# Patient Record
Sex: Male | Born: 1990 | Race: Black or African American | Hispanic: No | Marital: Single | Smoking: Current some day smoker
Health system: Southern US, Community
[De-identification: ages and names within clinical notes are randomized; demographics above are authoritative.]

## PROBLEM LIST (undated history)

## (undated) DIAGNOSIS — G43909 Migraine, unspecified, not intractable, without status migrainosus: Secondary | ICD-10-CM

## (undated) DIAGNOSIS — S62309A Unspecified fracture of unspecified metacarpal bone, initial encounter for closed fracture: Secondary | ICD-10-CM

## (undated) DIAGNOSIS — Z8709 Personal history of other diseases of the respiratory system: Secondary | ICD-10-CM

## (undated) DIAGNOSIS — S52501A Unspecified fracture of the lower end of right radius, initial encounter for closed fracture: Secondary | ICD-10-CM

## (undated) HISTORY — PX: EYE MUSCLE SURGERY: SHX370

---
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
## 2012-01-31 ENCOUNTER — Encounter (HOSPITAL_BASED_OUTPATIENT_CLINIC_OR_DEPARTMENT_OTHER): Payer: Self-pay | Admitting: Emergency Medicine

## 2012-01-31 DIAGNOSIS — M25519 Pain in unspecified shoulder: Secondary | ICD-10-CM | POA: Insufficient documentation

## 2012-01-31 DIAGNOSIS — Z043 Encounter for examination and observation following other accident: Secondary | ICD-10-CM | POA: Insufficient documentation

## 2012-01-31 DIAGNOSIS — M25559 Pain in unspecified hip: Secondary | ICD-10-CM | POA: Insufficient documentation

## 2012-01-31 DIAGNOSIS — M79609 Pain in unspecified limb: Secondary | ICD-10-CM | POA: Insufficient documentation

## 2012-01-31 NOTE — ED Notes (Signed)
Pt ambulatory without difficulty. Pt picking up 21 y/o child with injured arm without difficulty.
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
## 2012-02-01 ENCOUNTER — Emergency Department (HOSPITAL_BASED_OUTPATIENT_CLINIC_OR_DEPARTMENT_OTHER)
Admission: EM | Admit: 2012-02-01 | Discharge: 2012-02-01 | Discharge status: Against Medical Advice | Payer: No Typology Code available for payment source | Attending: Emergency Medicine | Admitting: Emergency Medicine

## 2012-02-01 NOTE — ED Notes (Signed)
Pt LWBS due to wait. Pt a7O x 4. Pt ambulatory without difficulty

## 2014-02-24 DIAGNOSIS — S52501A Unspecified fracture of the lower end of right radius, initial encounter for closed fracture: Secondary | ICD-10-CM

## 2014-02-24 DIAGNOSIS — S62309A Unspecified fracture of unspecified metacarpal bone, initial encounter for closed fracture: Secondary | ICD-10-CM

## 2014-02-24 HISTORY — DX: Unspecified fracture of unspecified metacarpal bone, initial encounter for closed fracture: S62.309A

## 2014-02-24 HISTORY — DX: Unspecified fracture of the lower end of right radius, initial encounter for closed fracture: S52.501A

## 2014-02-25 ENCOUNTER — Emergency Department (HOSPITAL_COMMUNITY)
Admission: EM | Admit: 2014-02-25 | Discharge: 2014-02-25 | Disposition: A | Discharge status: Home / Self Care | Payer: Self-pay | Attending: Emergency Medicine | Admitting: Emergency Medicine

## 2014-02-25 ENCOUNTER — Emergency Department (HOSPITAL_COMMUNITY): Payer: Self-pay

## 2014-02-25 ENCOUNTER — Encounter (HOSPITAL_COMMUNITY): Payer: Self-pay | Admitting: Emergency Medicine

## 2014-02-25 DIAGNOSIS — S52599A Other fractures of lower end of unspecified radius, initial encounter for closed fracture: Secondary | ICD-10-CM | POA: Insufficient documentation

## 2014-02-25 DIAGNOSIS — S62339A Displaced fracture of neck of unspecified metacarpal bone, initial encounter for closed fracture: Secondary | ICD-10-CM | POA: Insufficient documentation

## 2014-02-25 DIAGNOSIS — F17200 Nicotine dependence, unspecified, uncomplicated: Secondary | ICD-10-CM | POA: Insufficient documentation

## 2014-02-25 DIAGNOSIS — S5290XA Unspecified fracture of unspecified forearm, initial encounter for closed fracture: Secondary | ICD-10-CM

## 2014-02-25 MED ORDER — OXYCODONE-ACETAMINOPHEN 5-325 MG PO TABS: 2.0000 | ORAL_TABLET | Freq: Four times a day (QID) | ORAL | Status: DC | PRN

## 2014-02-25 MED ORDER — OXYCODONE-ACETAMINOPHEN 5-325 MG PO TABS
2.0000 | ORAL_TABLET | Freq: Once | ORAL | Status: AC
  Administered 2014-02-25: 2 via ORAL
  Filled 2014-02-25: qty 2

## 2014-02-25 NOTE — Progress Notes (Signed)
Orthopedic Tech Progress Note Patient Details:  Marvin ProvidenceCharles R Phillips Jan 16, 1991 [id number]  Ortho Devices Type of Ortho Device: Ace wrap;Arm sling;Sugartong splint;Ulna gutter splint Ortho Device/Splint Location: RUE Ortho Device/Splint Interventions: Ordered   Marvin Moccasinnthony Craig Jawaun Phillips 02/25/2014, 4:47 PM

## 2014-02-25 NOTE — ED Notes (Signed)
Ortho paged. 
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
## 2014-02-25 NOTE — ED Provider Notes (Signed)
Patient signed out to me by [facility] For Behavioral Healthzekalski, PA-C.  Plan:    Follow-up with Dr. Merlyn LotKuzma.  SPLINT APPLICATION Date/Time: 4:20 PM Authorized by: Roxy Horsemanobert Sham Alviar Consent: Verbal consent obtained. Risks and benefits: risks, benefits and alternatives were discussed Consent given by: patient Splint applied by: orthopedic technician Location details: right wrist Splint type: Sugar tong/ulnar gutter  Supplies used: fiberglass  Post-procedure: The splinted body part was neurovascularly unchanged following the procedure. Patient tolerance: Patient tolerated the procedure well with no immediate complications.  Will discharge the patient to home and have him follow-up with Dr. Merlyn LotKuzma.  Percocet for pain.     Roxy Horsemanobert Minnah Llamas, PA-C 02/25/14 [phone]
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
## 2014-02-26 NOTE — ED Provider Notes (Signed)
Medical screening examination/treatment/procedure(s) were performed by non-physician practitioner and as supervising physician I was immediately available for consultation/collaboration.   EKG Interpretation None       Lakevia Perris R. Valera Vallas, MD 02/26/14 0054 

## 2014-02-27 ENCOUNTER — Encounter (HOSPITAL_BASED_OUTPATIENT_CLINIC_OR_DEPARTMENT_OTHER): Payer: Self-pay | Admitting: *Deleted

## 2014-02-28 ENCOUNTER — Other Ambulatory Visit: Payer: Self-pay | Admitting: Orthopedic Surgery

## 2014-02-28 NOTE — ED Provider Notes (Signed)
Medical screening examination/treatment/procedure(s) were performed by non-physician practitioner and as supervising physician I was immediately available for consultation/collaboration.   EKG Interpretation None       Shah Insley R. Kendre Sires, MD 02/28/14 1102 

## 2014-03-04 ENCOUNTER — Encounter (HOSPITAL_BASED_OUTPATIENT_CLINIC_OR_DEPARTMENT_OTHER): Payer: Self-pay | Admitting: Certified Registered"

## 2014-03-04 ENCOUNTER — Ambulatory Visit (HOSPITAL_BASED_OUTPATIENT_CLINIC_OR_DEPARTMENT_OTHER): Payer: Self-pay | Admitting: Certified Registered"

## 2014-03-04 ENCOUNTER — Encounter (HOSPITAL_BASED_OUTPATIENT_CLINIC_OR_DEPARTMENT_OTHER)
Admission: RE | Disposition: A | Discharge status: Home / Self Care | Payer: Self-pay | Source: Ambulatory Visit | Attending: Orthopedic Surgery

## 2014-03-04 ENCOUNTER — Ambulatory Visit (HOSPITAL_BASED_OUTPATIENT_CLINIC_OR_DEPARTMENT_OTHER)
Admission: RE | Admit: 2014-03-04 | Discharge: 2014-03-04 | Disposition: A | Discharge status: Home / Self Care | Payer: Self-pay | Source: Ambulatory Visit | Attending: Orthopedic Surgery | Admitting: Orthopedic Surgery

## 2014-03-04 DIAGNOSIS — G43909 Migraine, unspecified, not intractable, without status migrainosus: Secondary | ICD-10-CM | POA: Insufficient documentation

## 2014-03-04 DIAGNOSIS — F17200 Nicotine dependence, unspecified, uncomplicated: Secondary | ICD-10-CM | POA: Insufficient documentation

## 2014-03-04 DIAGNOSIS — Z91013 Allergy to seafood: Secondary | ICD-10-CM | POA: Insufficient documentation

## 2014-03-04 DIAGNOSIS — Y9367 Activity, basketball: Secondary | ICD-10-CM | POA: Insufficient documentation

## 2014-03-04 DIAGNOSIS — S52599A Other fractures of lower end of unspecified radius, initial encounter for closed fracture: Secondary | ICD-10-CM | POA: Insufficient documentation

## 2014-03-04 DIAGNOSIS — W19XXXA Unspecified fall, initial encounter: Secondary | ICD-10-CM | POA: Insufficient documentation

## 2014-03-04 DIAGNOSIS — S63006A Unspecified dislocation of unspecified wrist and hand, initial encounter: Secondary | ICD-10-CM | POA: Insufficient documentation

## 2014-03-04 DIAGNOSIS — S62339A Displaced fracture of neck of unspecified metacarpal bone, initial encounter for closed fracture: Secondary | ICD-10-CM | POA: Insufficient documentation

## 2014-03-04 HISTORY — DX: Unspecified fracture of the lower end of right radius, initial encounter for closed fracture: S52.501A

## 2014-03-04 HISTORY — PX: OPEN REDUCTION INTERNAL FIXATION (ORIF) DISTAL RADIAL FRACTURE: SHX5989

## 2014-03-04 HISTORY — DX: Migraine, unspecified, not intractable, without status migrainosus: G43.909

## 2014-03-04 HISTORY — DX: Personal history of other diseases of the respiratory system: Z87.09

## 2014-03-04 HISTORY — DX: Unspecified fracture of unspecified metacarpal bone, initial encounter for closed fracture: S62.309A

## 2014-03-04 HISTORY — PX: CLOSED REDUCTION METACARPAL WITH PERCUTANEOUS PINNING: SHX5613

## 2014-03-04 LAB — POCT HEMOGLOBIN-HEMACUE: Hemoglobin: 16.1 g/dL (ref 13.0–17.0)

## 2014-03-04 SURGERY — OPEN REDUCTION INTERNAL FIXATION (ORIF) DISTAL RADIUS FRACTURE
Anesthesia: Regional | Site: Wrist | Laterality: Right

## 2014-03-04 MED ORDER — FENTANYL CITRATE 0.05 MG/ML IJ SOLN
50.0000 ug | INTRAMUSCULAR | Status: DC | PRN
  Administered 2014-03-04: 100 ug via INTRAVENOUS

## 2014-03-04 MED ORDER — LACTATED RINGERS IV SOLN
INTRAVENOUS | Status: DC
  Administered 2014-03-04 (×2): via INTRAVENOUS

## 2014-03-04 MED ORDER — MIDAZOLAM HCL 5 MG/5ML IJ SOLN
INTRAMUSCULAR | Status: DC | PRN
  Administered 2014-03-04: 2 mg via INTRAVENOUS

## 2014-03-04 MED ORDER — FENTANYL CITRATE 0.05 MG/ML IJ SOLN
INTRAMUSCULAR | Status: AC
  Filled 2014-03-04: qty 6

## 2014-03-04 MED ORDER — ACETAMINOPHEN 325 MG PO TABS: 325.0000 mg | ORAL_TABLET | ORAL | Status: DC | PRN

## 2014-03-04 MED ORDER — ONDANSETRON HCL 4 MG/2ML IJ SOLN: INTRAMUSCULAR | Status: DC | PRN

## 2014-03-04 MED ORDER — CEFAZOLIN SODIUM-DEXTROSE 2-3 GM-% IV SOLR
INTRAVENOUS | Status: AC
  Filled 2014-03-04: qty 50

## 2014-03-04 MED ORDER — ONDANSETRON HCL 4 MG/2ML IJ SOLN: 4.0000 mg | Freq: Once | INTRAMUSCULAR | Status: DC | PRN

## 2014-03-04 MED ORDER — ONDANSETRON HCL 4 MG/2ML IJ SOLN
INTRAMUSCULAR | Status: DC | PRN
  Administered 2014-03-04: 4 mg via INTRAVENOUS

## 2014-03-04 MED ORDER — LIDOCAINE HCL (CARDIAC) 20 MG/ML IV SOLN
INTRAVENOUS | Status: DC | PRN
  Administered 2014-03-04: 30 mg via INTRAVENOUS

## 2014-03-04 MED ORDER — MIDAZOLAM HCL 2 MG/2ML IJ SOLN
INTRAMUSCULAR | Status: AC
  Filled 2014-03-04: qty 2

## 2014-03-04 MED ORDER — FENTANYL CITRATE 0.05 MG/ML IJ SOLN
INTRAMUSCULAR | Status: AC
  Filled 2014-03-04: qty 2

## 2014-03-04 MED ORDER — PROPOFOL 10 MG/ML IV BOLUS
INTRAVENOUS | Status: DC | PRN
  Administered 2014-03-04: 240 mg via INTRAVENOUS

## 2014-03-04 MED ORDER — FENTANYL CITRATE 0.05 MG/ML IJ SOLN: 25.0000 ug | INTRAMUSCULAR | Status: DC | PRN

## 2014-03-04 MED ORDER — ROPIVACAINE HCL 5 MG/ML IJ SOLN
INTRAMUSCULAR | Status: DC | PRN
  Administered 2014-03-04: 20 mL via PERINEURAL

## 2014-03-04 MED ORDER — BUPIVACAINE HCL (PF) 0.25 % IJ SOLN
INTRAMUSCULAR | Status: AC
  Filled 2014-03-04: qty 30

## 2014-03-04 MED ORDER — KETOROLAC TROMETHAMINE 30 MG/ML IJ SOLN: 15.0000 mg | Freq: Once | INTRAMUSCULAR | Status: DC | PRN

## 2014-03-04 MED ORDER — MIDAZOLAM HCL 2 MG/2ML IJ SOLN
1.0000 mg | INTRAMUSCULAR | Status: DC | PRN
  Administered 2014-03-04: 2 mg via INTRAVENOUS

## 2014-03-04 MED ORDER — ACETAMINOPHEN 160 MG/5ML PO SOLN: 325.0000 mg | ORAL | Status: DC | PRN

## 2014-03-04 MED ORDER — OXYCODONE-ACETAMINOPHEN 5-325 MG PO TABS: ORAL_TABLET | ORAL | Status: DC

## 2014-03-04 MED ORDER — CHLORHEXIDINE GLUCONATE 4 % EX LIQD: 60.0000 mL | Freq: Once | CUTANEOUS | Status: DC

## 2014-03-04 MED ORDER — OXYCODONE HCL 5 MG/5ML PO SOLN: 5.0000 mg | Freq: Once | ORAL | Status: DC | PRN

## 2014-03-04 MED ORDER — FENTANYL CITRATE 0.05 MG/ML IJ SOLN
INTRAMUSCULAR | Status: DC | PRN
  Administered 2014-03-04: 25 ug via INTRAVENOUS

## 2014-03-04 MED ORDER — DEXAMETHASONE SODIUM PHOSPHATE 10 MG/ML IJ SOLN
INTRAMUSCULAR | Status: DC | PRN
  Administered 2014-03-04: 10 mg via INTRAVENOUS

## 2014-03-04 MED ORDER — CEFAZOLIN SODIUM-DEXTROSE 2-3 GM-% IV SOLR
2.0000 g | INTRAVENOUS | Status: AC
  Administered 2014-03-04: 2 g via INTRAVENOUS

## 2014-03-04 MED ORDER — MIDAZOLAM HCL 2 MG/ML PO SYRP: 12.0000 mg | ORAL_SOLUTION | Freq: Once | ORAL | Status: DC | PRN

## 2014-03-04 MED ORDER — OXYCODONE HCL 5 MG PO TABS: 5.0000 mg | ORAL_TABLET | Freq: Once | ORAL | Status: DC | PRN

## 2014-03-04 SURGICAL SUPPLY — 75 items
BANDAGE ELASTIC 3 VELCRO ST LF (GAUZE/BANDAGES/DRESSINGS) ×4 IMPLANT
BIT DRILL 2.0 LNG QUCK RELEASE (BIT) IMPLANT
BIT DRILL 2.8X5 QR DISP (BIT) ×2 IMPLANT
BLADE MINI RND TIP GREEN BEAV (BLADE) IMPLANT
BLADE SURG 15 STRL LF DISP TIS (BLADE) ×4 IMPLANT
BLADE SURG 15 STRL SS (BLADE) ×8
BNDG CMPR 9X4 STRL LF SNTH (GAUZE/BANDAGES/DRESSINGS) ×2
BNDG CMPR MD 5X2 ELC HKLP STRL (GAUZE/BANDAGES/DRESSINGS)
BNDG ELASTIC 2 VLCR STRL LF (GAUZE/BANDAGES/DRESSINGS) IMPLANT
BNDG ESMARK 4X9 LF (GAUZE/BANDAGES/DRESSINGS) ×4 IMPLANT
BNDG GAUZE ELAST 4 BULKY (GAUZE/BANDAGES/DRESSINGS) ×4 IMPLANT
CHLORAPREP W/TINT 26ML (MISCELLANEOUS) ×4 IMPLANT
CORDS BIPOLAR (ELECTRODE) ×4 IMPLANT
COVER MAYO STAND STRL (DRAPES) ×4 IMPLANT
COVER TABLE BACK 60X90 (DRAPES) ×4 IMPLANT
CUFF TOURNIQUET SINGLE 18IN (TOURNIQUET CUFF) ×4 IMPLANT
DRAPE EXTREMITY T 121X128X90 (DRAPE) ×4 IMPLANT
DRAPE OEC MINIVIEW 54X84 (DRAPES) ×4 IMPLANT
DRAPE SURG 17X23 STRL (DRAPES) ×4 IMPLANT
DRILL 2.0 LNG QUICK RELEASE (BIT) ×4
GAUZE XEROFORM 1X8 LF (GAUZE/BANDAGES/DRESSINGS) ×4 IMPLANT
GLOVE BIO SURGEON STRL SZ7.5 (GLOVE) ×6 IMPLANT
GLOVE BIOGEL PI IND STRL 8 (GLOVE) ×2 IMPLANT
GLOVE BIOGEL PI IND STRL 8.5 (GLOVE) IMPLANT
GLOVE BIOGEL PI INDICATOR 8 (GLOVE) ×2
GLOVE BIOGEL PI INDICATOR 8.5 (GLOVE) ×2
GLOVE SURG ORTHO 8.0 STRL STRW (GLOVE) ×2 IMPLANT
GOWN STRL REUS W/ TWL LRG LVL3 (GOWN DISPOSABLE) ×2 IMPLANT
GOWN STRL REUS W/ TWL XL LVL3 (GOWN DISPOSABLE) IMPLANT
GOWN STRL REUS W/TWL LRG LVL3 (GOWN DISPOSABLE)
GOWN STRL REUS W/TWL XL LVL3 (GOWN DISPOSABLE) ×10 IMPLANT
GUIDEWIRE ORTHO 0.054X6 (WIRE) ×6 IMPLANT
K-WIRE .035X4 (WIRE) ×6 IMPLANT
NDL HYPO 25X1 1.5 SAFETY (NEEDLE) IMPLANT
NEEDLE HYPO 22GX1.5 SAFETY (NEEDLE) IMPLANT
NEEDLE HYPO 25X1 1.5 SAFETY (NEEDLE) IMPLANT
NS IRRIG 1000ML POUR BTL (IV SOLUTION) ×4 IMPLANT
PACK BASIN DAY SURGERY FS (CUSTOM PROCEDURE TRAY) ×4 IMPLANT
PAD CAST 3X4 CTTN HI CHSV (CAST SUPPLIES) ×2 IMPLANT
PAD CAST 4YDX4 CTTN HI CHSV (CAST SUPPLIES) IMPLANT
PADDING CAST ABS 4INX4YD NS (CAST SUPPLIES) ×2
PADDING CAST ABS COTTON 4X4 ST (CAST SUPPLIES) ×2 IMPLANT
PADDING CAST COTTON 3X4 STRL (CAST SUPPLIES) ×4
PADDING CAST COTTON 4X4 STRL (CAST SUPPLIES)
PLATE PROXIMAL VDU ACULOC (Plate) ×2 IMPLANT
SCREW CORT FT 18X2.3XLCK HEX (Screw) IMPLANT
SCREW CORT FT 20X2.3XLCK HEX (Screw) IMPLANT
SCREW CORTICAL LOCKING 2.3X18M (Screw) ×4 IMPLANT
SCREW CORTICAL LOCKING 2.3X20M (Screw) ×20 IMPLANT
SCREW FX20X2.3XSMTH LCK NS CRT (Screw) IMPLANT
SCREW HEX 3.5X15 NLCKG STRL (Screw) IMPLANT
SCREW HEX 3.5X15MM (Screw) ×4 IMPLANT
SCREW HEXALOBE NON-LOCK 3.5X14 (Screw) ×2 IMPLANT
SCREW HEXALOBE NON-LOCK 3.5X16 (Screw) ×2 IMPLANT
SLEEVE SCD COMPRESS KNEE MED (MISCELLANEOUS) ×2 IMPLANT
SLING ARM XL FOAM STRAP (SOFTGOODS) ×2 IMPLANT
SPLINT PLASTER CAST XFAST 3X15 (CAST SUPPLIES) IMPLANT
SPLINT PLASTER CAST XFAST 4X15 (CAST SUPPLIES) IMPLANT
SPLINT PLASTER XTRA FAST SET 4 (CAST SUPPLIES)
SPLINT PLASTER XTRA FASTSET 3X (CAST SUPPLIES)
SPONGE GAUZE 4X4 12PLY (GAUZE/BANDAGES/DRESSINGS) ×4 IMPLANT
STOCKINETTE 4X48 STRL (DRAPES) ×4 IMPLANT
SUCTION FRAZIER TIP 10 FR DISP (SUCTIONS) IMPLANT
SUT ETHILON 3 0 PS 1 (SUTURE) IMPLANT
SUT ETHILON 4 0 PS 2 18 (SUTURE) ×4 IMPLANT
SUT MERSILENE 4 0 P 3 (SUTURE) IMPLANT
SUT VIC AB 3-0 PS1 18 (SUTURE)
SUT VIC AB 3-0 PS1 18XBRD (SUTURE) IMPLANT
SUT VICRYL 4-0 PS2 18IN ABS (SUTURE) ×6 IMPLANT
SYR BULB 3OZ (MISCELLANEOUS) ×4 IMPLANT
SYR CONTROL 10ML LL (SYRINGE) IMPLANT
TOWEL OR 17X24 6PK STRL BLUE (TOWEL DISPOSABLE) ×8 IMPLANT
TUBE CONNECTING 20'X1/4 (TUBING)
TUBE CONNECTING 20X1/4 (TUBING) IMPLANT
UNDERPAD 30X30 INCONTINENT (UNDERPADS AND DIAPERS) ×4 IMPLANT

## 2014-03-04 NOTE — Op Note (Signed)
Intra-operative fluoroscopic images in the AP, lateral, and oblique views were taken and evaluated by myself.  Reduction and hardware placement were confirmed.  There was no intraarticular penetration of permanent hardware.  

## 2014-03-04 NOTE — Brief Op Note (Signed)
03/04/2014  3:03 PM  PATIENT:  Marvin Phillips  23 y.o. male  PRE-OPERATIVE DIAGNOSIS:  RIGHT DISTAL RADIUS FRACTURE RIGHT SMALL METACARPAL FRACTURE SCAPHOLUNATE WIDENING   POST-OPERATIVE DIAGNOSIS:  RIGHT DISTAL RADIUS FRACTURE RIGHT SMALL METACARPAL FRACTURE SCAPHOLUNATE WIDENING   PROCEDURE:  Procedure(s): OPEN REDUCTION INTERNAL FIXATION (ORIF) DISTAL RADIUS FRACTURE  (Right) CLOSED REDUCTION PERCUTANEOUS PINNING RIGHT SMALL METACARPAL (Right)  SURGEON:  Surgeon(s) and Role:    * Tami RibasKevin R Rosemae Mcquown, MD - Primary    * Nicki ReaperGary R Shimeka Bacot, MD  PHYSICIAN ASSISTANT:   ASSISTANTS: Cindee SaltGary Mayco Walrond, MD   ANESTHESIA:   regional and general  EBL:  Total I/O In: 1700 [I.V.:1700] Out: -   BLOOD ADMINISTERED:none  DRAINS: none   LOCAL MEDICATIONS USED:  NONE  SPECIMEN:  No Specimen  DISPOSITION OF SPECIMEN:  N/A  COUNTS:  YES  TOURNIQUET:  80 minutes at 250 mmHg  DICTATION: .Other Dictation: Dictation Number [id number]  PLAN OF CARE: Discharge to home after PACU  PATIENT DISPOSITION:  PACU - hemodynamically stable.
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
## 2014-03-04 NOTE — Anesthesia Postprocedure Evaluation (Signed)
  Anesthesia Post-op Note  Patient: Marvin Phillips  Procedure(s) Performed: Procedure(s): OPEN REDUCTION INTERNAL FIXATION (ORIF) DISTAL RADIUS FRACTURE  (Right) CLOSED REDUCTION PERCUTANEOUS PINNING RIGHT SMALL METACARPAL (Right)  Patient Location: PACU  Anesthesia Type:GA combined with regional for post-op pain  Level of Consciousness: awake, alert  and oriented  Airway and Oxygen Therapy: Patient Spontanous Breathing  Post-op Pain: none  Post-op Assessment: Post-op Vital signs reviewed  Post-op Vital Signs: Reviewed  Last Vitals:  Filed Vitals:   03/04/14 1501  BP:   Pulse: 52  Temp:   Resp: 19    Complications: No apparent anesthesia complications

## 2014-03-04 NOTE — Progress Notes (Signed)
Assisted Dr. Moser with right, ultrasound guided, supraclavicular block. Side rails up, monitors on throughout procedure. See vital signs in flow sheet. Tolerated Procedure well. °

## 2014-03-04 NOTE — Op Note (Signed)
NAMJimmye Norman:  Pecina, Taariq               ACCOUNT NO.:  [id number]  MEDICAL RECORD NO.:  [id number]  LOCATION:                                 FACILITY:  PHYSICIAN:  Betha LoaKevin Sahiba Granholm, MD        DATE OF BIRTH:  [birth year]  DATE OF PROCEDURE:  03/04/2014 DATE OF DISCHARGE:                              OPERATIVE REPORT   PREOPERATIVE DIAGNOSIS:  Right intra-articular distal radius fracture, and right small finger metacarpal neck fracture, and right scapholunate widening.  POSTOPERATIVE DIAGNOSIS:  Right distal radius intra-articular fracture, grade 1 scapholunate injury, and right small finger metacarpal neck fracture.  PROCEDURE:   1. Open reduction and internal fixation right distal radius fracture.   2. Arthrotomy of wrist with inspection of scapholunate ligament 3. Closed reduction percutaneous pinning right small finger metacarpal  neck fracture.  SURGEON:  Betha LoaKevin Mubarak Bevens, MD.  ASSISTANT:  Cindee SaltGary Deshae Dickison, MD.  ANESTHESIA:  General with regional.  IV FLUIDS:  Per anesthesia flow sheet.  ESTIMATED BLOOD LOSS:  Minimal.  COMPLICATIONS:  None.  SPECIMENS:  None.  TOURNIQUET TIME:  1 hour 20 minutes.  DISPOSITION:  Stable to PACU.  INDICATIONS:  Mr. Izola PriceMyers is a 23 year old male, who last week was playing basketball when he fell on his right arm.  He was seen in the emergency department.  Radiographs were taken revealing a right distal radius fracture, right small finger metacarpal neck fracture.  He will follow up with me in the office.  He was noted to have widening of scapholunate interval as well.  We discussed nonoperative and operative treatment options.  Risks, benefits, and alternatives of surgery were discussed including risk of blood loss, infection, damage to nerves, vessels, tendons, ligaments, bone; failure of surgery; need for additional surgery, complications with wound healing, continued pain, nonunion, malunion, and stiffness.  He voiced understanding of these risks  and elected to proceed  OPERATIVE COURSE:  After being identified preoperatively by myself, the patient and I agreed upon procedure and site of procedure.  Surgical site was marked.  Risks, benefits, and alternatives of surgery were reviewed and wished to proceed.  Surgical consent had been signed. Regional block was performed by anesthesia in preop holding.  He was given IV Ancef as preoperative antibiotic prophylaxis.  He was transferred to the operating room and placed on the operating table in supine position with the right upper extremity on arm board.  General anesthesia was induced by anesthesiologist.  The right upper extremity was prepped and draped in normal sterile orthopedic fashion.  Surgical pause was performed between surgeons, anesthesia, operating staff, and all were in agreement as to the patient, procedure, and site of procedure.  Tourniquet at the proximal aspect of the extremity was  inflated to 250 mmHg after exsanguination of the limb with Esmarch bandage.  Standard volar Sherilyn CooterHenry approach was used.  Soft tissues were divided by spreading technique.  Bipolar electrocautery was used to obtain hemostasis.  The superficial and deep portions of the FCR tendon sheath were incised.  The FCR and FPL swept ulnarly to protect the palmar cutaneous branch of the median nerve.  Brachioradialis was released from the radial side of the  radius.  The pronator quadratus was released and elevated with periosteal elevator.  The fracture site was identified.  This was cleared of soft tissue interposition.  A small arthrotomy was made at the wrist joint and the fracture fragment was able to be elevated and the scapholunate ligament visualized.  It was intact, but stretched.  The joint and fracture site were copiously irrigated with sterile saline.  All soft tissue interposition was removed.  The fracture was reduced under direct visualization.  A plate from the Acumed volar distal radial  locking set was selected and secured to the bone with the guide pins.  The positioning was adjusted until appropriate fit had been obtained.  The C-arm was used in AP and lateral projections to ensure appropriate reduction and position of hardware, which was the case.  Standard AO drilling and measuring technique was used.  A single screw was placed in the slotted hole in the shaft of the plate.  The distal screw holes were then filled with locking pegs with the exception of the radial styloid holes, which were filled with locking screws.  The remaining 2 holes in the shaft of the plate were filled with nonlocking screws.  The C-arm was used in AP, lateral, and oblique projections to ensure appropriate reduction and position of hardware, which was the case.  There was no intra-articular penetration. There was good articular reduction.  Attention was turned to the small finger metacarpal neck fracture.  A closed reduction was performed.  The C-arm was used in AP and lateral projections to ensure appropriate reduction and position.  Three 0.035-inch K-wires were then advanced from the ulnar side of the hand across the small finger metacarpal into the ring finger metacarpal; two were distal to the fracture and one was proximal to the fracture.  This was adequate to provide stabilization of the fracture.  The C-arm was used in AP, lateral, and oblique projections to ensure appropriate reduction and position of hardware, which was the case.  Pins were bent and cut short.  The wound at the wrist was copiously irrigated with sterile saline.  The arthrotomy was repaired with 4-0 Vicryl suture in a figure-of-eight fashion.  The pronator quadratus was repaired back over top of the plate using 4-0 Vicryl suture and single inverted interrupted Vicryl suture was placed in the subcutaneous tissues.  The skin was closed with 4-0 nylon in a horizontal mattress fashion.  The wounds were dressed with  sterile Xeroform, 4x4s, and wrapped with a Kerlix bandage.  A volar and dorsal slab splint including the long, ring, and small fingers was placed and wrapped with Kerlix and Ace bandage.  Tourniquet was deflated at 1 hour 20 minutes.  The fingertips were pink with brisk capillary refill after deflation of tourniquet.  The operative drapes were broken down.  The patient was awoken from anesthesia safely.  He was transferred back to stretcher and taken to PACU in stable condition.  I will give him Percocet 5/325 one to two p.o. q.6 hours p.r.n. pain, dispensed #40.  I will see him back in the office 1 week for postoperative followup.     Betha LoaKevin Andreu Drudge, MD     KK/MEDQ  D:  03/04/2014  T:  03/04/2014  Job:  [id number]

## 2014-03-04 NOTE — Discharge Instructions (Addendum)
[facility] Instructions °Hand Surgery ° °Wound Care: °Keep your hand elevated above the level of your heart.  Do not allow it to dangle by your side.  Keep the dressing dry and do not remove it unless your doctor advises you to do so.  He will usually change it at the time of your post-op visit.  Moving your fingers is advised to stimulate circulation but will depend on the site of your surgery.  If you have a splint applied, your doctor will advise you regarding movement. ° °Activity: °Do not drive or operate machinery today.  Rest today and then you may return to your normal activity and work as indicated by your physician. ° °Diet:  °Drink liquids today or eat a light diet.  You may resume a regular diet tomorrow.   ° °General expectations: °Pain for two to three days. °Fingers may become slightly swollen. ° °Call your doctor if any of the following occur: °Severe pain not relieved by pain medication. °Elevated temperature. °Dressing soaked with blood. °Inability to move fingers. °White or bluish color to fingers. ° ° °Post Anesthesia Home Care Instructions ° °Activity: °Get plenty of rest for the remainder of the day. A responsible adult should stay with you for 24 hours following the procedure.  °For the next 24 hours, DO NOT: °-Drive a car °-Operate machinery °-Drink alcoholic beverages °-Take any medication unless instructed by your physician °-Make any legal decisions or sign important papers. ° °Meals: °Start with liquid foods such as gelatin or soup. Progress to regular foods as tolerated. Avoid greasy, spicy, heavy foods. If nausea and/or vomiting occur, drink only clear liquids until the nausea and/or vomiting subsides. Call your physician if vomiting continues. ° °Special Instructions/Symptoms: °Your throat may feel dry or sore from the anesthesia or the breathing tube placed in your throat during surgery. If this causes discomfort, gargle with warm salt water. The discomfort should disappear within 24  hours. ° ° °Regional Anesthesia Blocks ° °1. Numbness or the inability to move the "blocked" extremity may last from 3-48 hours after placement. The length of time depends on the medication injected and your individual response to the medication. If the numbness is not going away after 48 hours, call your surgeon. ° °2. The extremity that is blocked will need to be protected until the numbness is gone and the  Strength has returned. Because you cannot feel it, you will need to take extra care to avoid injury. Because it may be weak, you may have difficulty moving it or using it. You may not know what position it is in without looking at it while the block is in effect. ° °3. For blocks in the legs and feet, returning to weight bearing and walking needs to be done carefully. You will need to wait until the numbness is entirely gone and the strength has returned. You should be able to move your leg and foot normally before you try and bear weight or walk. You will need someone to be with you when you first try to ensure you do not fall and possibly risk injury. ° °4. Bruising and tenderness at the needle site are common side effects and will resolve in a few days. ° °5. Persistent numbness or new problems with movement should be communicated to the surgeon or the [facility] ([phone])/ [facility] ([phone]). °

## 2014-03-04 NOTE — Transfer of Care (Signed)
Immediate Anesthesia Transfer of Care Note  Patient: Marvin Phillips  Procedure(s) Performed: Procedure(s): OPEN REDUCTION INTERNAL FIXATION (ORIF) DISTAL RADIUS FRACTURE  (Right) CLOSED REDUCTION PERCUTANEOUS PINNING RIGHT SMALL METACARPAL (Right)  Patient Location: PACU  Anesthesia Type:GA combined with regional for post-op pain  Level of Consciousness: awake and patient cooperative  Airway & Oxygen Therapy: Patient Spontanous Breathing and Patient connected to face mask oxygen  Post-op Assessment: Report given to PACU RN and Post -op Vital signs reviewed and stable  Post vital signs: Reviewed and stable  Complications: No apparent anesthesia complications

## 2014-03-04 NOTE — Anesthesia Procedure Notes (Addendum)
Anesthesia Regional Block:  Supraclavicular block  Pre-Anesthetic Checklist: ,, timeout performed, Correct Patient, Correct Site, Correct Laterality, Correct Procedure, Correct Position, site marked, Risks and benefits discussed,  Surgical consent,  Pre-op evaluation,  At surgeon's request and post-op pain management  Laterality: Upper and Right  Prep: chloraprep       Needles:  Injection technique: Single-shot  Needle Type: Echogenic Needle          Additional Needles:  Procedures: ultrasound guided (picture in chart) Supraclavicular block Narrative:  Start time: 03/04/2014 10:42 AM End time: 03/04/2014 10:47 AM Injection made incrementally with aspirations every 5 mL.  Performed by: Personally  Anesthesiologist: Moser  Additional Notes: H+P and labs reviewed, risks and benefits discussed with patient, procedure tolerated well without complications   Procedure Name: LMA Insertion Date/Time: 03/04/2014 1:21 PM Performed by: Vada Swift Pre-anesthesia Checklist: Patient identified, Emergency Drugs available, Suction available and Patient being monitored Patient Re-evaluated:Patient Re-evaluated prior to inductionOxygen Delivery Method: Circle System Utilized Preoxygenation: Pre-oxygenation with 100% oxygen Intubation Type: IV induction Ventilation: Mask ventilation without difficulty LMA: LMA inserted LMA Size: 5.0 Number of attempts: 1 Airway Equipment and Method: bite block Placement Confirmation: positive ETCO2 Tube secured with: Tape Dental Injury: Teeth and Oropharynx as per pre-operative assessment

## 2014-03-04 NOTE — Op Note (Signed)
[id number] 

## 2014-03-04 NOTE — Anesthesia Preprocedure Evaluation (Signed)
Anesthesia Evaluation  Patient identified by MRN, date of birth, ID band Patient awake    Reviewed: Allergy & Precautions, H&P , NPO status , Patient's Chart, lab work & pertinent test results  History of Anesthesia Complications Negative for: history of anesthetic complications  Airway Mallampati: I TM Distance: >3 FB Neck ROM: Full    Dental  (+) Teeth Intact   Pulmonary neg shortness of breath, neg sleep apnea, neg COPDneg recent URI, Current Smoker,  breath sounds clear to auscultation        Cardiovascular negative cardio ROS  Rhythm:Regular     Neuro/Psych negative neurological ROS  negative psych ROS   GI/Hepatic negative GI ROS, Neg liver ROS,   Endo/Other  negative endocrine ROS  Renal/GU negative Renal ROS     Musculoskeletal   Abdominal   Peds  Hematology negative hematology ROS (+)   Anesthesia Other Findings   Reproductive/Obstetrics                           Anesthesia Physical Anesthesia Plan  ASA: I  Anesthesia Plan: General and Regional   Post-op Pain Management:    Induction: Intravenous  Airway Management Planned: LMA  Additional Equipment: None  Intra-op Plan:   Post-operative Plan: Extubation in OR  Informed Consent: I have reviewed the patients History and Physical, chart, labs and discussed the procedure including the risks, benefits and alternatives for the proposed anesthesia with the patient or authorized representative who has indicated his/her understanding and acceptance.   Dental advisory given  Plan Discussed with: CRNA and Surgeon  Anesthesia Plan Comments:         Anesthesia Quick Evaluation

## 2014-03-05 ENCOUNTER — Encounter (HOSPITAL_BASED_OUTPATIENT_CLINIC_OR_DEPARTMENT_OTHER): Payer: Self-pay | Admitting: Orthopedic Surgery

## 2014-03-10 ENCOUNTER — Ambulatory Visit: Payer: Self-pay | Attending: Orthopedic Surgery | Admitting: *Deleted

## 2014-03-10 DIAGNOSIS — M79609 Pain in unspecified limb: Secondary | ICD-10-CM | POA: Insufficient documentation

## 2014-03-10 DIAGNOSIS — R609 Edema, unspecified: Secondary | ICD-10-CM | POA: Insufficient documentation

## 2014-03-10 DIAGNOSIS — Z5189 Encounter for other specified aftercare: Secondary | ICD-10-CM | POA: Insufficient documentation

## 2014-03-13 ENCOUNTER — Ambulatory Visit: Payer: No Typology Code available for payment source | Admitting: *Deleted

## 2014-03-25 ENCOUNTER — Ambulatory Visit: Payer: Self-pay | Attending: Orthopedic Surgery | Admitting: Occupational Therapy

## 2014-03-25 DIAGNOSIS — Z5189 Encounter for other specified aftercare: Secondary | ICD-10-CM | POA: Insufficient documentation

## 2014-03-25 DIAGNOSIS — R609 Edema, unspecified: Secondary | ICD-10-CM | POA: Insufficient documentation

## 2014-03-25 DIAGNOSIS — M79609 Pain in unspecified limb: Secondary | ICD-10-CM | POA: Insufficient documentation

## 2014-04-30 ENCOUNTER — Emergency Department (HOSPITAL_COMMUNITY)
Admission: EM | Admit: 2014-04-30 | Discharge: 2014-04-30 | Disposition: A | Discharge status: Home / Self Care | Payer: No Typology Code available for payment source

## 2015-01-22 ENCOUNTER — Emergency Department (HOSPITAL_BASED_OUTPATIENT_CLINIC_OR_DEPARTMENT_OTHER)
Admission: EM | Admit: 2015-01-22 | Discharge: 2015-01-22 | Disposition: A | Discharge status: Home / Self Care | Payer: No Typology Code available for payment source | Attending: Emergency Medicine | Admitting: Emergency Medicine

## 2015-01-22 ENCOUNTER — Encounter (HOSPITAL_BASED_OUTPATIENT_CLINIC_OR_DEPARTMENT_OTHER): Payer: Self-pay | Admitting: *Deleted

## 2015-01-22 ENCOUNTER — Emergency Department (HOSPITAL_BASED_OUTPATIENT_CLINIC_OR_DEPARTMENT_OTHER): Payer: No Typology Code available for payment source

## 2015-01-22 DIAGNOSIS — J45909 Unspecified asthma, uncomplicated: Secondary | ICD-10-CM | POA: Insufficient documentation

## 2015-01-22 DIAGNOSIS — Y9389 Activity, other specified: Secondary | ICD-10-CM | POA: Insufficient documentation

## 2015-01-22 DIAGNOSIS — S8992XA Unspecified injury of left lower leg, initial encounter: Secondary | ICD-10-CM | POA: Insufficient documentation

## 2015-01-22 DIAGNOSIS — Y998 Other external cause status: Secondary | ICD-10-CM | POA: Diagnosis not present

## 2015-01-22 DIAGNOSIS — Y92410 Unspecified street and highway as the place of occurrence of the external cause: Secondary | ICD-10-CM | POA: Diagnosis not present

## 2015-01-22 DIAGNOSIS — S39012A Strain of muscle, fascia and tendon of lower back, initial encounter: Secondary | ICD-10-CM

## 2015-01-22 DIAGNOSIS — S3991XA Unspecified injury of abdomen, initial encounter: Secondary | ICD-10-CM | POA: Diagnosis not present

## 2015-01-22 DIAGNOSIS — S3992XA Unspecified injury of lower back, initial encounter: Secondary | ICD-10-CM | POA: Insufficient documentation

## 2015-01-22 DIAGNOSIS — Z8781 Personal history of (healed) traumatic fracture: Secondary | ICD-10-CM | POA: Insufficient documentation

## 2015-01-22 DIAGNOSIS — M25531 Pain in right wrist: Secondary | ICD-10-CM

## 2015-01-22 DIAGNOSIS — S6991XA Unspecified injury of right wrist, hand and finger(s), initial encounter: Secondary | ICD-10-CM | POA: Diagnosis not present

## 2015-01-22 DIAGNOSIS — M25562 Pain in left knee: Secondary | ICD-10-CM

## 2015-01-22 DIAGNOSIS — Z8679 Personal history of other diseases of the circulatory system: Secondary | ICD-10-CM | POA: Insufficient documentation

## 2015-01-22 LAB — URINALYSIS, ROUTINE W REFLEX MICROSCOPIC
BILIRUBIN URINE: NEGATIVE
GLUCOSE, UA: NEGATIVE mg/dL
HGB URINE DIPSTICK: NEGATIVE
Ketones, ur: NEGATIVE mg/dL
Leukocytes, UA: NEGATIVE
Nitrite: NEGATIVE
PROTEIN: NEGATIVE mg/dL
Specific Gravity, Urine: 1.008 (ref 1.005–1.030)
Urobilinogen, UA: 1 mg/dL (ref 0.0–1.0)
pH: 7.5 (ref 5.0–8.0)

## 2015-01-22 NOTE — ED Notes (Signed)
Pt calls this rn into room. Pt getting dressed, states "I'm leaving, I have to go..." explained to patient that his test results are not back yet. Pt states "This is fucking bullshit! I can't wait around here all day! I have been waiting too long, you all are not doing your jobs!" pt updated on pending test results and usual wait time for those results. Pt cont cursing, this rn leaves room.
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
## 2015-01-22 NOTE — Discharge Instructions (Signed)
Please call your doctor for a followup appointment within 24-48 hours. When you talk to your doctor please let them know that you were seen in the emergency department and have them acquire all of your records so that they can discuss the findings with you and formulate a treatment plan to fully care for your new and ongoing problems. Please follow-up with orthopedics Please rest, ice, elevate-toes above nose Please avoid any physical shortness activity Please apply knee sleeve and wrist brace for comfort purposes Please continue to monitor symptoms closely and if symptoms are to worsen or change (fever greater than 101, chills, sweating, nausea, vomiting, chest pain, shortness of breathe, difficulty breathing, weakness, numbness, tingling, worsening or changes to pain pattern, fall, injury, headache, dizziness, inability to food or fluids down) please report back to the Emergency Department immediately.    Arthralgia Your caregiver has diagnosed you as suffering from an arthralgia. Arthralgia means there is pain in a joint. This can come from many reasons including:  Bruising the joint which causes soreness (inflammation) in the joint.  Wear and tear on the joints which occur as we grow older (osteoarthritis).  Overusing the joint.  Various forms of arthritis.  Infections of the joint. Regardless of the cause of pain in your joint, most of these different pains respond to anti-inflammatory drugs and rest. The exception to this is when a joint is infected, and these cases are treated with antibiotics, if it is a bacterial infection. HOME CARE INSTRUCTIONS   Rest the injured area for as long as directed by your caregiver. Then slowly start using the joint as directed by your caregiver and as the pain allows. Crutches as directed may be useful if the ankles, knees or hips are involved. If the knee was splinted or casted, continue use and care as directed. If an stretchy or elastic wrapping  bandage has been applied today, it should be removed and re-applied every 3 to 4 hours. It should not be applied tightly, but firmly enough to keep swelling down. Watch toes and feet for swelling, bluish discoloration, coldness, numbness or excessive pain. If any of these problems (symptoms) occur, remove the ace bandage and re-apply more loosely. If these symptoms persist, contact your caregiver or return to this location.  For the first 24 hours, keep the injured extremity elevated on pillows while lying down.  Apply ice for 15-20 minutes to the sore joint every couple hours while awake for the first half day. Then 03-04 times per day for the first 48 hours. Put the ice in a plastic bag and place a towel between the bag of ice and your skin.  Wear any splinting, casting, elastic bandage applications, or slings as instructed.  Only take over-the-counter or prescription medicines for pain, discomfort, or fever as directed by your caregiver. Do not use aspirin immediately after the injury unless instructed by your physician. Aspirin can cause increased bleeding and bruising of the tissues.  If you were given crutches, continue to use them as instructed and do not resume weight bearing on the sore joint until instructed. Persistent pain and inability to use the sore joint as directed for more than 2 to 3 days are warning signs indicating that you should see a caregiver for a follow-up visit as soon as possible. Initially, a hairline fracture (break in bone) may not be evident on X-rays. Persistent pain and swelling indicate that further evaluation, non-weight bearing or use of the joint (use of crutches or slings as  instructed), or further X-rays are indicated. X-rays may sometimes not show a small fracture until a week or 10 days later. Make a follow-up appointment with your own caregiver or one to whom we have referred you. A radiologist (specialist in reading X-rays) may read your X-rays. Make sure you  know how you are to obtain your X-ray results. Do not assume everything is normal if you do not hear from [place]. SEEK MEDICAL CARE IF: Bruising, swelling, or pain increases. SEEK IMMEDIATE MEDICAL CARE IF:   Your fingers or toes are numb or blue.  The pain is not responding to medications and continues to stay the same or get worse.  The pain in your joint becomes severe.  You develop a fever over 102 F (38.9 C).  It becomes impossible to move or use the joint. MAKE SURE YOU:   Understand these instructions.  Will watch your condition.  Will get help right away if you are not doing well or get worse. Document Released: 10/31/2005 Document Revised: 01/23/2012 Document Reviewed: 06/18/2008 [facility] Patient Information 2015 [place], [place]. This information is not intended to replace advice given to you by your health care provider. Make sure you discuss any questions you have with your health care provider.   Emergency Department Resource Guide 1) Find a Doctor and Pay Out of Pocket Although you won't have to find out who is covered by your insurance plan, it is a good idea to ask around and get recommendations. You will then need to call the office and see if the doctor you have chosen will accept you as a new patient and what types of options they offer for patients who are self-pay. Some doctors offer discounts or will set up payment plans for their patients who do not have insurance, but you will need to ask so you aren't surprised when you get to your appointment.  2) Contact Your Local Health Department Not all health departments have doctors that can see patients for sick visits, but many do, so it is worth a call to see if yours does. If you don't know where your local health department is, you can check in your phone book. The CDC also has a tool to help you locate your state's health department, and many state websites also have listings of all of their local health  departments.  3) Find a Walk-in Clinic If your illness is not likely to be very severe or complicated, you may want to try a walk in clinic. These are popping up all over the country in pharmacies, drugstores, and shopping centers. They're usually staffed by nurse practitioners or physician assistants that have been trained to treat common illnesses and complaints. They're usually fairly quick and inexpensive. However, if you have serious medical issues or chronic medical problems, these are probably not your best option.  No Primary Care Doctor: - Call Health Connect at  [phone] - they can help you locate a primary care doctor that  accepts your insurance, provides certain services, etc. - Physician Referral Service- [phone]  Chronic Pain Problems: Organization         Address  Phone   Notes  [facility]  [phone] Patients need to be referred by their primary care doctor.   Medication Assistance: Organization         Address  Phone   Notes  [facility] Medication [facility] [address] [place] [place], [place] [address] [phone] --Must be a  resident of [facility] -- Must have NO insurance coverage whatsoever (no Medicaid/ Medicare, etc.) -- The pt. MUST have a primary care doctor that directs their care regularly and follows them in the community   MedAssist  [phone]   [employer]  [phone]    Agencies that provide inexpensive medical care: Organization         Address  Phone   Notes  [facility]  [phone]   [facility]    [phone]   [facility] [address] [place], [place] [address] [phone]   [facility] [address] [place] [place] [phone]   Planned Parenthood    [phone]   [facility]    [phone]   [facility] and [facility]  [address], [place] Phone:  [phone], Fax:  [phone] Hours of Operation:  9 am - 6 pm, M-F.  Also accepts Medicaid/Medicare and self-pay.  [facility]  [address], [place] Phone: [phone], Fax: [phone]. Hours of Operation:  8:30 am - 5:30 pm, M-F.  Also accepts Medicaid and self-pay.  [facility] [place] [address] [place] Phone: [phone]   [facility] [address] Natasha Bence [place], [place] [phone], Ext. [phone] Mondays & Thursdays: 7-9 AM.  First 15 patients are seen on a first come, first serve basis.    Medicaid-accepting [facility] Providers:  Organization         Address  Phone   Notes  [facility] [address], [place] [phone] Also accepts self-pay patients.  [facility] [address] Laure[place], [place]  [phone]   [facility] [address], [place] [phone]   [facility] Family Medicine [address] [place] [phone]   Renaye Rakers [address], [place]   [phone] Only accepts [place] patients after they have their name applied to their card.   Self-Pay (no insurance) in [facility]:  Organization         Address  Phone   Notes  Sickle Cell Patients, [facility] Internal Medicine [address] [place], [place] [phone]   [facility] Urgent Care [address] [place], [place] [phone]   [facility] [place]  [address], [place] [phone]   [facility]/Dr. Osei-Bonsu  [address] [place] or [address], [place] [phone] Phone number for both [place] and [place] locations is the same.  [facility] [address], [place] [phone]   [facility] [address], [place] or [address] [phone]   [facility] [address] [place] [phone], phone; [phone], fax Sees patients 1st and 3rd Saturday of every month.  Must not qualify for public or private insurance (i.e. Medicaid, Medicare, [place] Health Choice, Veterans' Benefits)  Household income should be no more than 200% of the poverty level The clinic cannot treat you if you are pregnant or think you are pregnant  Sexually transmitted diseases are not treated at the clinic.    Dental Care: Organization  Address  Phone  Notes  [facility] [place], [place] [phone] Accepts children up to age 78 who are enrolled in [place] or [place]; pregnant women with a Medicaid card; and children who have applied for Medicaid or [place] Health Choice, but were declined, whose parents can pay a reduced fee at time of service.  [facility] of [facility]  [address], [place] [phone] Accepts children up to age 27 who are enrolled in [place] or [place]; pregnant women with a Medicaid card; and children who have applied for Medicaid or [place] Health Choice, but were declined, whose parents can pay a reduced fee at time of service.  [place] Adult Dental Access PROGRAM  [place] [phone] Patients are seen by appointment only. Walk-ins are not accepted. [place] will see patients 41 years of age and older. Monday - Tuesday (8am-5pm) Most Wednesdays (8:30-5pm) $30 per visit, cash only  [facility] Adult Dental Access PROGRAM  [address], [facility] [phone] Patients are seen by appointment only. Walk-ins are not accepted. [place] will see patients 58 years of age and older. One Wednesday Evening (Monthly: Volunteer Based).  $30 per visit, cash only  [place]  [phone] for adults;  Children under age 3, call Graduate Pediatric Dentistry at [phone]. Children aged 20-14, please call [phone] to request a pediatric application.  Dental services are provided in all areas of dental care including fillings, crowns and bridges, complete and partial dentures, implants, gum treatment, root canals, and extractions. Preventive care is also provided. Treatment is provided to both adults and children. Patients are selected via a lottery and there is often a waiting list.   [facility] [address], [place]  [phone] [email or web address]   [facility] [address] [place], [place] [phone], Ext. [phone] Second and Fourth Thursday of each month, opens at 6:30 AM; Clinic ends at 9 AM.  Patients are seen on a first-come first-served basis, and a limited number are seen during each clinic.   [facility]  [address] Hillard Danker [place], [place] [phone]   Eligibility Requirements You must have lived in [place], [place], or [place] counties for at least the last three months.   You cannot be eligible for state or federal sponsored [employer], including [employer], [place], or [employer].   You generally cannot be eligible for healthcare insurance through your employer.    How to apply: Eligibility screenings are held every Tuesday and Wednesday afternoon from 1:00 pm until 4:00 pm. You do not need an appointment for the interview!  [facility] [address] [place], [place]   [place]  [place] Department  Lupus  [phone]    Behavioral Health Resources in the Community: Intensive Outpatient Programs Organization         Address  Phone  Notes  [place]. [address] [place], [place] [phone]   [facility] [address], [place], [place]   ADS: [facility] [address], [place], [place]   [place] [address]  [place], [place] or [phone]   Substance Abuse Resources  Organization         Address  Phone  Notes  [facility]  [phone]   [facility]  [phone]   [facility]  [phone]   [facility]  [phone]   Residential & Outpatient Substance Abuse Program  [phone]   Psychological Services Organization         Address  Phone  Notes  [facility]  [phone]   [facility]  [phone]- [phone]   [facility] [address], [place] [phone] or [phone]    Mobile Crisis Teams Organization         Address  Phone  Notes  [facility], Mobile Crisis Care Unit  [phone]   [facility]  [address] [place], [place] [phone]   Doristine LocksSharon DeEsch [address] [place] [phone]    Self-Help/Support Groups Organization         Address  Phone             Notes  [facility] [place] - variety of support groups  336- [phone] Call for more information  Narcotics Anonymous (NA), [facility] [address], [place]  2 meetings at this location   Statisticianesidential Treatment Programs Organization         Address  Phone  Notes  [facility] [address],    [place]  [phone]   [facility]  [address], [place] [phone], [place], [place] [phone]   [facility] [address], [place] [phone] Admissions: 8am-3pm M-F  [facility] [address]    [place], [place] [phone]   [facility] [address], [place], [place] [phone]   [facility] [address]  [place], [place] [phone]   [facility] - Intensive  Outpatient [address], [place], [place] [phone]   [facility] ([facility] [address]  [place], [place] [phone] or [phone]   [facility] ([facility]) [address] [place], [place] [phone] Accepts Medicaid  [facility] [address]  [place] [phone] Substance Abuse/Addiction Treatment   [facility] Resources Organization         Address  Phone  Notes  [facility]  [phone]) [phone]   Angie FavaJulie Brannon, PhD [address] [place], [place]   [phone](336) [phone] or [phone](336) [phone]   [facility]    [address] [place], [place] [phone](336) [phone]   [facility] [address], [place], [place] [phone] Insurance/Medicaid/sponsorship through [facility]  [facility] Families [address]                                    [place], [place] [phone] Therapy/tele-psych/case  [facility] [address]   [place], [place] [phone]) [phone]    Dr. Lolly MustacheArfeen  [phone](336) [phone]   [facility]  [employer] [facility] 1) [address], [place] 2) [address], [place] 3)  [address], [place] [phone]  [phone](336) [phone]   [facility] Child Abuse Hotline [phone]) [phone] or [phone]) [phone] (After Hours)
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
## 2015-05-05 ENCOUNTER — Telehealth: Payer: Self-pay | Admitting: *Deleted

## 2015-05-05 NOTE — Telephone Encounter (Signed)
Pt signed ROI received via fax from [employer]. Forwarded to Jordan to scan/email to medical records. JG/CMA 

## 2018-01-07 ENCOUNTER — Other Ambulatory Visit: Payer: Self-pay

## 2018-01-07 ENCOUNTER — Encounter (HOSPITAL_COMMUNITY): Payer: Self-pay | Admitting: *Deleted

## 2018-01-07 ENCOUNTER — Ambulatory Visit (HOSPITAL_COMMUNITY)
Admission: EM | Admit: 2018-01-07 | Discharge: 2018-01-07 | Disposition: A | Discharge status: Home / Self Care | Payer: Self-pay | Attending: Family Medicine | Admitting: Family Medicine

## 2018-01-07 DIAGNOSIS — K047 Periapical abscess without sinus: Secondary | ICD-10-CM

## 2018-01-07 MED ORDER — HYDROCODONE-ACETAMINOPHEN 5-325 MG PO TABS: 1.0000 | ORAL_TABLET | Freq: Four times a day (QID) | ORAL | 0 refills | Status: AC | PRN

## 2018-01-07 MED ORDER — AMOXICILLIN-POT CLAVULANATE 875-125 MG PO TABS: 1.0000 | ORAL_TABLET | Freq: Two times a day (BID) | ORAL | 0 refills | Status: AC

## 2018-01-07 NOTE — ED Triage Notes (Addendum)
Dental pain causing facial swelling, per pt his tooth broke 6 months ago. Left side,

## 2018-01-07 NOTE — Discharge Instructions (Signed)
Please use dental resource to contact offices to seek permenant treatment/relief.   Today we have given you an antibiotic. This should help with pain as any infection is cleared. Please take every 12 hours for 7 days  For pain please take 600mg -800mg  of Ibuprofen every 8 hours, take with 1000 mg of Tylenol Extra strength every 8 hours. These are safe to take together. Please take with food. OR Aleeve  I have also provided 2 days worth of stronger pain medication. This should only be used for severe pain. Do not drive or operate machinery while taking this medication.   Please return if you start to experience significant swelling of your face, experiencing fever.

## 2018-01-07 NOTE — ED Provider Notes (Signed)
[facility]    CSN: [id number] Arrival date & time: 01/07/18  1415     History   Chief Complaint Chief Complaint  Patient presents with  . Dental Pain    HPI Marvin Phillips is a 27 y.o. male nonprotruding past medical history presenting today with dental pain and facial swelling.  States that he has had some swelling to his left upper jaw over the past 3-4 days.  He states he broke the tooth approximately 6 months ago.  Denies fevers, denies trouble swallowing, denies issues with eating.  Denies shortness of breath or chest pain.  Patient requesting work note as he missed work last night.  He has been taking Aleve and drinking to help with the pain.  HPI  Past Medical History:  Diagnosis Date  . Distal radius fracture, right 02/24/2014  . History of asthma    as a child  . Metacarpal bone fracture 02/24/2014   right small  . Migraines     There are no active problems to display for this patient.   Past Surgical History:  Procedure Laterality Date  . CLOSED REDUCTION METACARPAL WITH PERCUTANEOUS PINNING Right 03/04/2014   Procedure: CLOSED REDUCTION PERCUTANEOUS PINNING RIGHT SMALL METACARPAL;  Surgeon: Tami RibasKevin R Kuzma, MD;  Location: [facility];  Service: Orthopedics;  Laterality: Right;  . EYE MUSCLE SURGERY Left   . OPEN REDUCTION INTERNAL FIXATION (ORIF) DISTAL RADIAL FRACTURE Right 03/04/2014   Procedure: OPEN REDUCTION INTERNAL FIXATION (ORIF) DISTAL RADIUS FRACTURE ;  Surgeon: Tami RibasKevin R Kuzma, MD;  Location: [facility];  Service: Orthopedics;  Laterality: Right;       Home Medications    Prior to Admission medications   Medication Sig Start Date End Date Taking? Authorizing Provider  amoxicillin-clavulanate (AUGMENTIN) 875-125 MG tablet Take 1 tablet by mouth every 12 (twelve) hours for 7 days. 01/07/18 01/14/18  Harriett Azar C, PA-C  HYDROcodone-acetaminophen (NORCO/VICODIN) 5-325 MG tablet Take 1 tablet by mouth every 6  (six) hours as needed for up to 2 days. 01/07/18 01/09/18  Eular Panek, Junius CreamerHallie C, PA-C    Family History History reviewed. No pertinent family history.  Social History Social History   Tobacco Use  . Smoking status: Current Every Day Smoker    Packs/day: 1.50    Years: 10.00    Pack years: 15.00    Types: Cigarettes  . Smokeless tobacco: Never Used  Substance Use Topics  . Alcohol use: Yes    Comment: occasionally  . Drug use: No     Allergies   Shellfish allergy   Review of Systems Review of Systems  Constitutional: Negative for fever.  HENT: Positive for dental problem. Negative for congestion, sore throat and trouble swallowing.   Respiratory: Negative for cough and shortness of breath.   Cardiovascular: Negative for chest pain.  Gastrointestinal: Negative for nausea and vomiting.  Neurological: Positive for facial asymmetry. Negative for dizziness, light-headedness and headaches.     Physical Exam Triage Vital Signs ED Triage Vitals  Enc Vitals Group     BP 01/07/18 1446 105/65     Pulse Rate 01/07/18 1446 83     Resp --      Temp 01/07/18 1446 98.5 F (36.9 C)     Temp Source 01/07/18 1446 Oral     SpO2 01/07/18 1446 98 %     Weight --      Height --      Head Circumference --  Peak Flow --      Pain Score 01/07/18 1444 9     Pain Loc --      Pain Edu? --      Excl. in GC? --    No data found.  Updated Vital Signs BP 105/65 (BP Location: Left Arm)   Pulse 83   Temp 98.5 F (36.9 C) (Oral)   SpO2 98%   Visual Acuity Right Eye Distance:   Left Eye Distance:   Bilateral Distance:    Right Eye Near:   Left Eye Near:    Bilateral Near:     Physical Exam  Constitutional: He appears well-developed and well-nourished.  HENT:  Head: Normocephalic and atraumatic.  Mild swelling to left maxillary area, broken molar on left upper jaw, with gingival swelling.  Tenderness on manipulating buccal area for observation  Eyes: Conjunctivae are normal.    Neck: Neck supple.  Cardiovascular: Normal rate and regular rhythm.  No murmur heard. Pulmonary/Chest: Effort normal and breath sounds normal. No respiratory distress.  Musculoskeletal: He exhibits no edema.  Neurological: He is alert.  Skin: Skin is warm and dry.  Psychiatric: He has a normal mood and affect.  Nursing note and vitals reviewed.    UC Treatments / Results  Labs (all labs ordered are listed, but only abnormal results are displayed) Labs Reviewed - No data to display  EKG  EKG Interpretation None       Radiology No results found.  Procedures Procedures (including critical care time)  Medications Ordered in UC Medications - No data to display   Initial Impression / Assessment and Plan / UC Course  I have reviewed the triage vital signs and the nursing notes.  Pertinent labs & imaging results that were available during my care of the patient were reviewed by me and considered in my medical decision making (see chart for details).     Patient with fractured tooth, possible dental abscess.  Will provide Augmentin for infection.  Norco for 2 days, advised that this will cause sedation, advised do not drink while taking this.  He verbalized understanding.  Will use Aleve for mild to moderate pain.  Dental resource provided. Discussed strict return precautions. Patient verbalized understanding and is agreeable with plan.   Final Clinical Impressions(s) / UC Diagnoses   Final diagnoses:  Dental abscess    ED Discharge Orders        Ordered    amoxicillin-clavulanate (AUGMENTIN) 875-125 MG tablet  Every 12 hours     01/07/18 1459    HYDROcodone-acetaminophen (NORCO/VICODIN) 5-325 MG tablet  Every 6 hours PRN     01/07/18 1459       Controlled Substance Prescriptions [place] Controlled Substance Registry consulted? Yes, I have consulted the [place] Controlled Substances Registry for this patient, and feel the risk/benefit ratio today is favorable for  proceeding with this prescription for a controlled substance. Patient without records in registry.    Lew Dawes, PA-C 01/07/18 1506

## 2020-03-10 ENCOUNTER — Emergency Department (HOSPITAL_COMMUNITY)
Admission: EM | Admit: 2020-03-10 | Discharge: 2020-03-11 | Disposition: A | Discharge status: Home / Self Care | Payer: Self-pay | Attending: Emergency Medicine | Admitting: Emergency Medicine

## 2020-03-10 ENCOUNTER — Encounter (HOSPITAL_COMMUNITY): Payer: Self-pay

## 2020-03-10 DIAGNOSIS — R109 Unspecified abdominal pain: Secondary | ICD-10-CM | POA: Insufficient documentation

## 2020-03-10 DIAGNOSIS — R112 Nausea with vomiting, unspecified: Secondary | ICD-10-CM | POA: Insufficient documentation

## 2020-03-10 DIAGNOSIS — Z5321 Procedure and treatment not carried out due to patient leaving prior to being seen by health care provider: Secondary | ICD-10-CM | POA: Insufficient documentation

## 2020-03-10 LAB — COMPREHENSIVE METABOLIC PANEL
ALT: 17 U/L (ref 0–44)
AST: 16 U/L (ref 15–41)
Albumin: 4.6 g/dL (ref 3.5–5.0)
Alkaline Phosphatase: 64 U/L (ref 38–126)
Anion gap: 16 — ABNORMAL HIGH (ref 5–15)
BUN: 8 mg/dL (ref 6–20)
CO2: 23 mmol/L (ref 22–32)
Calcium: 10 mg/dL (ref 8.9–10.3)
Chloride: 102 mmol/L (ref 98–111)
Creatinine, Ser: 1.2 mg/dL (ref 0.61–1.24)
GFR calc Af Amer: >60 mL/min (ref >60)
GFR calc non Af Amer: >60 mL/min (ref >60)
Glucose, Bld: 121 mg/dL — ABNORMAL HIGH (ref 70–99)
Potassium: 3.2 mmol/L — ABNORMAL LOW (ref 3.5–5.1)
Sodium: 141 mmol/L (ref 135–145)
Total Bilirubin: 1.5 mg/dL — ABNORMAL HIGH (ref 0.3–1.2)
Total Protein: 8.4 g/dL — ABNORMAL HIGH (ref 6.5–8.1)

## 2020-03-10 LAB — CBC
HCT: 49.7 % (ref 39.0–52.0)
Hemoglobin: 17.7 g/dL — ABNORMAL HIGH (ref 13.0–17.0)
MCH: 33.8 pg (ref 26.0–34.0)
MCHC: 35.6 g/dL (ref 30.0–36.0)
MCV: 94.8 fL (ref 80.0–100.0)
Platelets: 203 10*3/uL (ref 150–400)
RBC: 5.24 MIL/uL (ref 4.22–5.81)
RDW: 13.1 % (ref 11.5–15.5)
WBC: 13.7 10*3/uL — ABNORMAL HIGH (ref 4.0–10.5)
nRBC: 0 % (ref 0.0–0.2)

## 2020-03-10 LAB — LIPASE, BLOOD: Lipase: 26 U/L (ref 11–51)

## 2020-03-10 MED ORDER — SODIUM CHLORIDE 0.9% FLUSH: 3.0000 mL | Freq: Once | INTRAVENOUS | Status: DC

## 2020-03-10 MED ORDER — ONDANSETRON 4 MG PO TBDP
4.0000 mg | ORAL_TABLET | Freq: Once | ORAL | Status: AC | PRN
  Administered 2020-03-10: 4 mg via ORAL
  Filled 2020-03-10: qty 1

## 2020-03-10 NOTE — ED Triage Notes (Signed)
Onset 2 days abd pain, N/V/D.  Last vomit PTA.   Tried pepto bismal, gas x with no relief.  Unable to tolerate any PO fluids/foods.   Eating/drinking makes worse.  Laying down makes better.

## 2020-03-11 NOTE — ED Notes (Signed)
Pt called for vitals, no response. 

## 2020-03-12 ENCOUNTER — Ambulatory Visit (INDEPENDENT_AMBULATORY_CARE_PROVIDER_SITE_OTHER): Payer: Self-pay

## 2020-03-12 ENCOUNTER — Ambulatory Visit (HOSPITAL_COMMUNITY)
Admission: EM | Admit: 2020-03-12 | Discharge: 2020-03-12 | Disposition: A | Discharge status: Home / Self Care | Payer: Self-pay | Attending: Urgent Care | Admitting: Urgent Care

## 2020-03-12 ENCOUNTER — Encounter (HOSPITAL_COMMUNITY): Payer: Self-pay

## 2020-03-12 DIAGNOSIS — R110 Nausea: Secondary | ICD-10-CM

## 2020-03-12 DIAGNOSIS — R1084 Generalized abdominal pain: Secondary | ICD-10-CM

## 2020-03-12 DIAGNOSIS — R112 Nausea with vomiting, unspecified: Secondary | ICD-10-CM

## 2020-03-12 DIAGNOSIS — K5900 Constipation, unspecified: Secondary | ICD-10-CM

## 2020-03-12 MED ORDER — DOCUSATE SODIUM 100 MG PO CAPS: 100.0000 mg | ORAL_CAPSULE | Freq: Two times a day (BID) | ORAL | 0 refills | Status: DC

## 2020-03-12 MED ORDER — ONDANSETRON 8 MG PO TBDP: 8.0000 mg | ORAL_TABLET | Freq: Three times a day (TID) | ORAL | 0 refills | Status: DC | PRN

## 2020-03-12 MED ORDER — ONDANSETRON 4 MG PO TBDP
ORAL_TABLET | ORAL | Status: AC
  Filled 2020-03-12: qty 1

## 2020-03-12 MED ORDER — FLEET ENEMA 7-19 GM/118ML RE ENEM: 1.0000 | ENEMA | Freq: Every day | RECTAL | 0 refills | Status: DC | PRN

## 2020-03-12 MED ORDER — ONDANSETRON HCL 4 MG/2ML IJ SOLN
4.0000 mg | Freq: Once | INTRAMUSCULAR | Status: AC
  Administered 2020-03-12: 11:00:00 4 mg via INTRAMUSCULAR

## 2020-03-12 MED ORDER — ONDANSETRON HCL 4 MG/2ML IJ SOLN
INTRAMUSCULAR | Status: AC
  Filled 2020-03-12: qty 2

## 2020-03-12 NOTE — Discharge Instructions (Addendum)
Please contact [facility] and establish care with a new PCP. It will be important to have complete evaluation of your new constipation. Specifically, you need to have work up to rule out a problem with the colon or at the level of the prostate.

## 2020-03-12 NOTE — ED Triage Notes (Signed)
Patient reports abdominal pain and n/v since Monday. Reports he feels like he may be constipated.

## 2020-03-12 NOTE — ED Provider Notes (Signed)
[facility]   MRN: [id number] DOB: 1990/12/03  Subjective:   Marvin Phillips is a 29 y.o. male presenting for 3 day hx of acute onset n/v, abdominal pain. Has not had a bowel movement this week. He is still able to eat foods, drink fluids but has a difficult time holding it down. He is still passing gas. Has tried Gas X, Tums, Pepto. Denies fever, cough, chest pain, shortness of breath. No hx of abdominal surgeries.  No personal or family history of colon issues or prostate issues.  States that he generally eats healthy, eats plenty of fiber regularly.  Denies taking chronic medications.  Allergies  Allergen Reactions  . Shellfish Allergy Shortness Of Breath and Swelling    Past Medical History:  Diagnosis Date  . Distal radius fracture, right 02/24/2014  . History of asthma    as a child  . Metacarpal bone fracture 02/24/2014   right small  . Migraines      Past Surgical History:  Procedure Laterality Date  . CLOSED REDUCTION METACARPAL WITH PERCUTANEOUS PINNING Right 03/04/2014   Procedure: CLOSED REDUCTION PERCUTANEOUS PINNING RIGHT SMALL METACARPAL;  Surgeon: Tami Ribas, MD;  Location: [facility];  Service: Orthopedics;  Laterality: Right;  . EYE MUSCLE SURGERY Left   . OPEN REDUCTION INTERNAL FIXATION (ORIF) DISTAL RADIAL FRACTURE Right 03/04/2014   Procedure: OPEN REDUCTION INTERNAL FIXATION (ORIF) DISTAL RADIUS FRACTURE ;  Surgeon: Tami Ribas, MD;  Location: [facility];  Service: Orthopedics;  Laterality: Right;    No family history on file.  Social History   Tobacco Use  . Smoking status: Current Every Day Smoker    Packs/day: 0.50    Years: 10.00    Pack years: 5.00    Types: Cigarettes  . Smokeless tobacco: Never Used  Substance Use Topics  . Alcohol use: Yes    Comment: occasionally  . Drug use: No    ROS   Objective:   Vitals: BP 106/72 (BP Location: Left Arm)   Pulse (!) 107   Temp 98.9 F (37.2 C)  (Oral)   Resp 15   SpO2 97%   Physical Exam Constitutional:      General: He is not in acute distress.    Appearance: Normal appearance. He is well-developed and normal weight. He is not ill-appearing, toxic-appearing or diaphoretic.  HENT:     Head: Normocephalic and atraumatic.     Right Ear: External ear normal.     Left Ear: External ear normal.     Nose: Nose normal.     Mouth/Throat:     Pharynx: Oropharynx is clear.  Eyes:     General: No scleral icterus.       Right eye: No discharge.        Left eye: No discharge.     Extraocular Movements: Extraocular movements intact.     Pupils: Pupils are equal, round, and reactive to light.  Cardiovascular:     Rate and Rhythm: Normal rate and regular rhythm.     Heart sounds: No murmur. No friction rub. No gallop.   Pulmonary:     Effort: Pulmonary effort is normal. No respiratory distress.     Breath sounds: No wheezing or rales.  Abdominal:     General: Bowel sounds are normal. There is no distension.     Palpations: Abdomen is soft. There is no mass.     Tenderness: There is generalized abdominal tenderness (mild throughout). There  is no guarding or rebound.  Musculoskeletal:     Cervical back: Normal range of motion.  Skin:    General: Skin is warm and dry.  Neurological:     Mental Status: He is alert and oriented to person, place, and time.  Psychiatric:        Mood and Affect: Mood normal.        Behavior: Behavior normal.        Thought Content: Thought content normal.        Judgment: Judgment normal.     DG Abd 1 View  Result Date: 03/12/2020 CLINICAL DATA:  Nausea, vomiting. Constipation. EXAM: ABDOMEN - 1 VIEW COMPARISON:  None. FINDINGS: No abnormal bowel dilatation is noted. Moderate amount of stool seen throughout the colon. No radio-opaque calculi or other significant radiographic abnormality are seen. IMPRESSION: Moderate stool burden. No evidence of bowel obstruction or ileus. Electronically Signed   By:  Marijo Conception M.D.   On: 03/12/2020 10:37    Assessment and Plan :   PDMP not reviewed this encounter.  1. Generalized abdominal pain   2. Nausea   3. Nausea and vomiting, intractability of vomiting not specified, unspecified vomiting type   4. Constipation, unspecified constipation type     X-ray ruled out bowel obstruction.  Counseled patient on need for further work-up for new onset constipation despite healthy diet. Counseled on need to establish with a PCP for further work-up and follow-up.  Provided patient with information to do so. Needs further evaluation for problem at the level of the colon or prostate.  Patient was given IM Zofran in clinic.  He is to use enema as an outpatient, docusate and Zofran as well. Counseled patient on potential for adverse effects with medications prescribed/recommended today, strict ER and return-to-clinic precautions discussed, patient verbalized understanding.    Jaynee Eagles, PA-C 03/12/20 1051

## 2021-02-08 ENCOUNTER — Other Ambulatory Visit: Payer: Self-pay

## 2021-02-08 ENCOUNTER — Ambulatory Visit (HOSPITAL_COMMUNITY)
Admission: EM | Admit: 2021-02-08 | Discharge: 2021-02-08 | Disposition: A | Discharge status: Home / Self Care | Payer: Self-pay | Attending: Urgent Care | Admitting: Urgent Care

## 2021-02-08 ENCOUNTER — Encounter (HOSPITAL_COMMUNITY): Payer: Self-pay

## 2021-02-08 DIAGNOSIS — Z202 Contact with and (suspected) exposure to infections with a predominantly sexual mode of transmission: Secondary | ICD-10-CM | POA: Insufficient documentation

## 2021-02-08 LAB — HIV ANTIBODY (ROUTINE TESTING W REFLEX): HIV Screen 4th Generation wRfx: NONREACTIVE
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
## 2021-02-08 NOTE — ED Triage Notes (Signed)
Pt presents for STD screening after partner tested positive for syphilis; pt states he is not having any symptoms.

## 2021-02-09 LAB — CYTOLOGY, (ORAL, ANAL, URETHRAL) ANCILLARY ONLY
Chlamydia: NEGATIVE
Comment: NEGATIVE
Comment: NEGATIVE
Comment: NORMAL
Neisseria Gonorrhea: NEGATIVE
Trichomonas: NEGATIVE

## 2021-02-09 LAB — RPR: RPR Ser Ql: NONREACTIVE

## 2021-09-14 ENCOUNTER — Other Ambulatory Visit: Payer: Self-pay

## 2021-09-14 ENCOUNTER — Emergency Department (HOSPITAL_COMMUNITY)
Admission: EM | Admit: 2021-09-14 | Discharge: 2021-09-14 | Disposition: A | Discharge status: Home / Self Care | Payer: No Typology Code available for payment source | Attending: Emergency Medicine | Admitting: Emergency Medicine

## 2021-09-14 ENCOUNTER — Encounter (HOSPITAL_COMMUNITY): Payer: Self-pay | Admitting: Emergency Medicine

## 2021-09-14 DIAGNOSIS — S8992XA Unspecified injury of left lower leg, initial encounter: Secondary | ICD-10-CM | POA: Diagnosis present

## 2021-09-14 DIAGNOSIS — J45909 Unspecified asthma, uncomplicated: Secondary | ICD-10-CM | POA: Diagnosis not present

## 2021-09-14 DIAGNOSIS — S46812A Strain of other muscles, fascia and tendons at shoulder and upper arm level, left arm, initial encounter: Secondary | ICD-10-CM | POA: Diagnosis not present

## 2021-09-14 DIAGNOSIS — S86912A Strain of unspecified muscle(s) and tendon(s) at lower leg level, left leg, initial encounter: Secondary | ICD-10-CM | POA: Insufficient documentation

## 2021-09-14 DIAGNOSIS — F17210 Nicotine dependence, cigarettes, uncomplicated: Secondary | ICD-10-CM | POA: Diagnosis not present

## 2021-09-14 DIAGNOSIS — Y92410 Unspecified street and highway as the place of occurrence of the external cause: Secondary | ICD-10-CM | POA: Insufficient documentation

## 2021-09-14 MED ORDER — IBUPROFEN 400 MG PO TABS
600.0000 mg | ORAL_TABLET | Freq: Once | ORAL | Status: AC
  Administered 2021-09-14: 600 mg via ORAL
  Filled 2021-09-14: qty 1

## 2021-09-14 NOTE — Discharge Instructions (Signed)
Use Tylenol every 4 hours and Motrin every 6 hours along with ice as needed for pain and swelling. Return for new or worsening signs or symptoms.
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
## 2021-09-21 IMAGING — DX DG ABDOMEN 1V
2 series · 2 of 2 positions shown · non-contrast
Comparison: None.

CLINICAL DATA: Nausea, vomiting. Constipation.

EXAM:
ABDOMEN - 1 VIEW

[abdomen kub (1 of 2)]
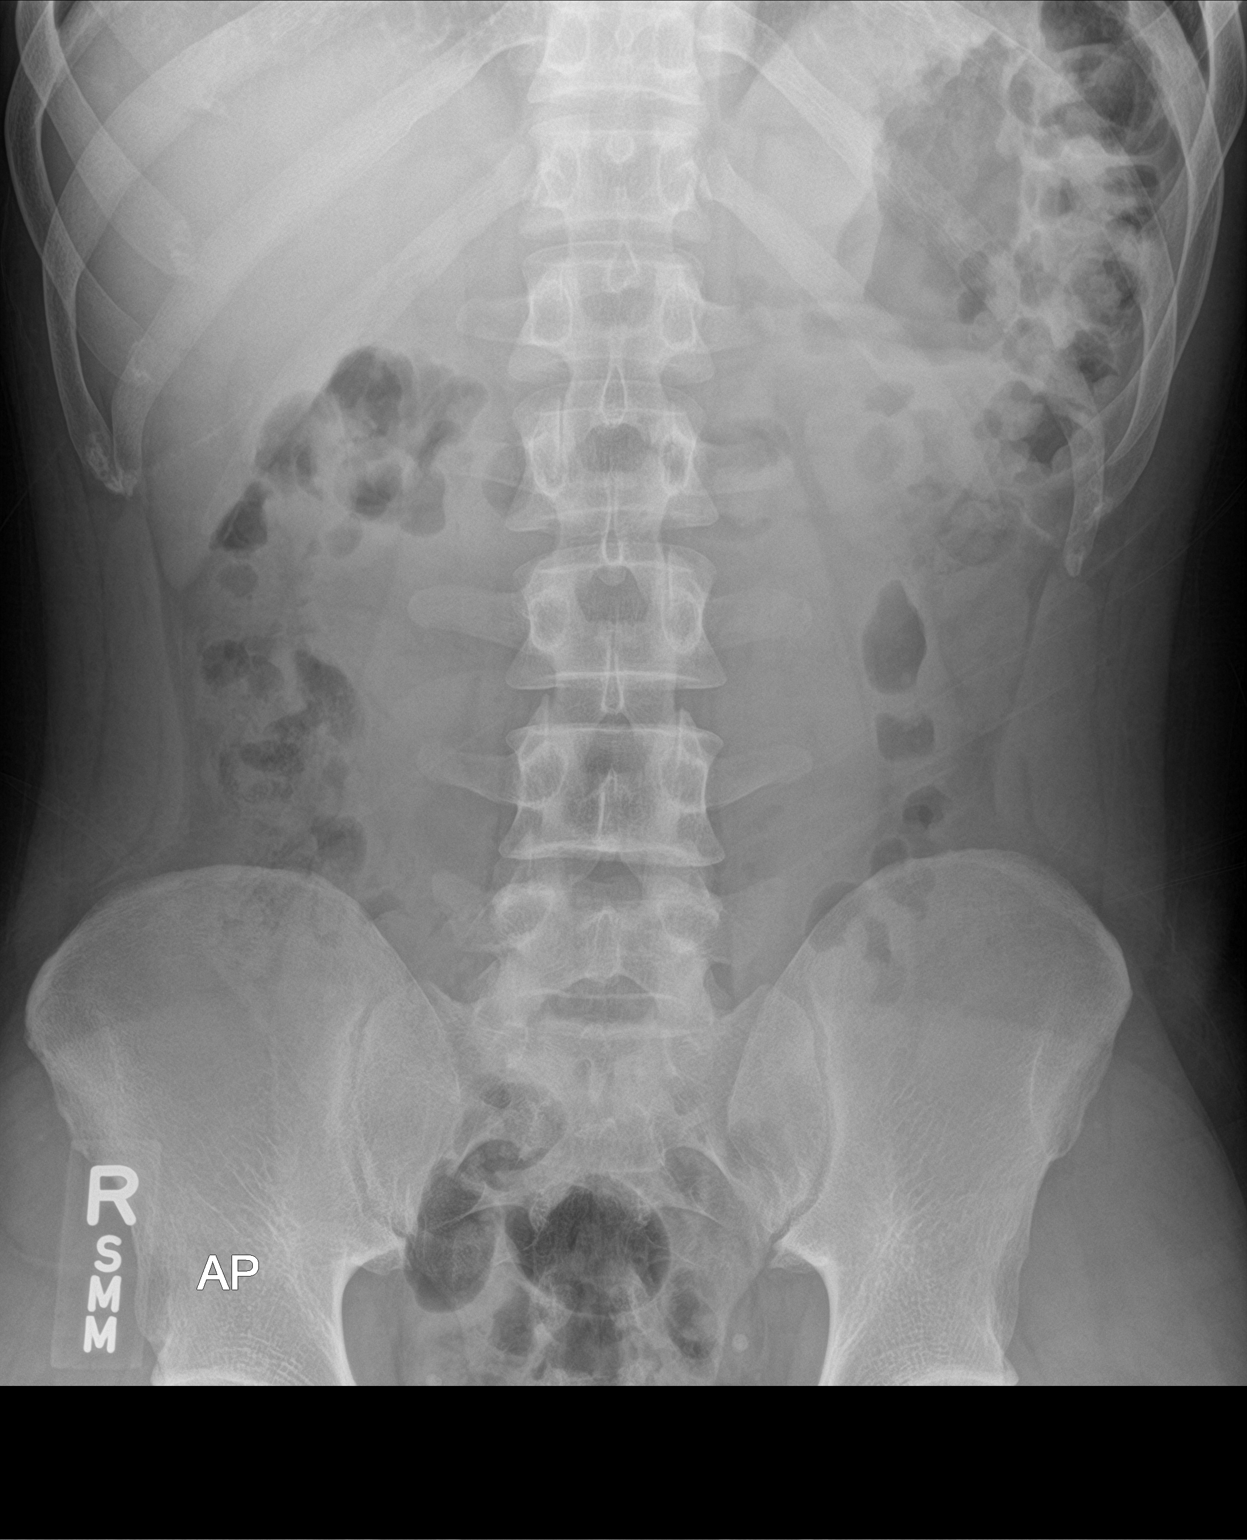

[abdomen kub (2 of 2)]
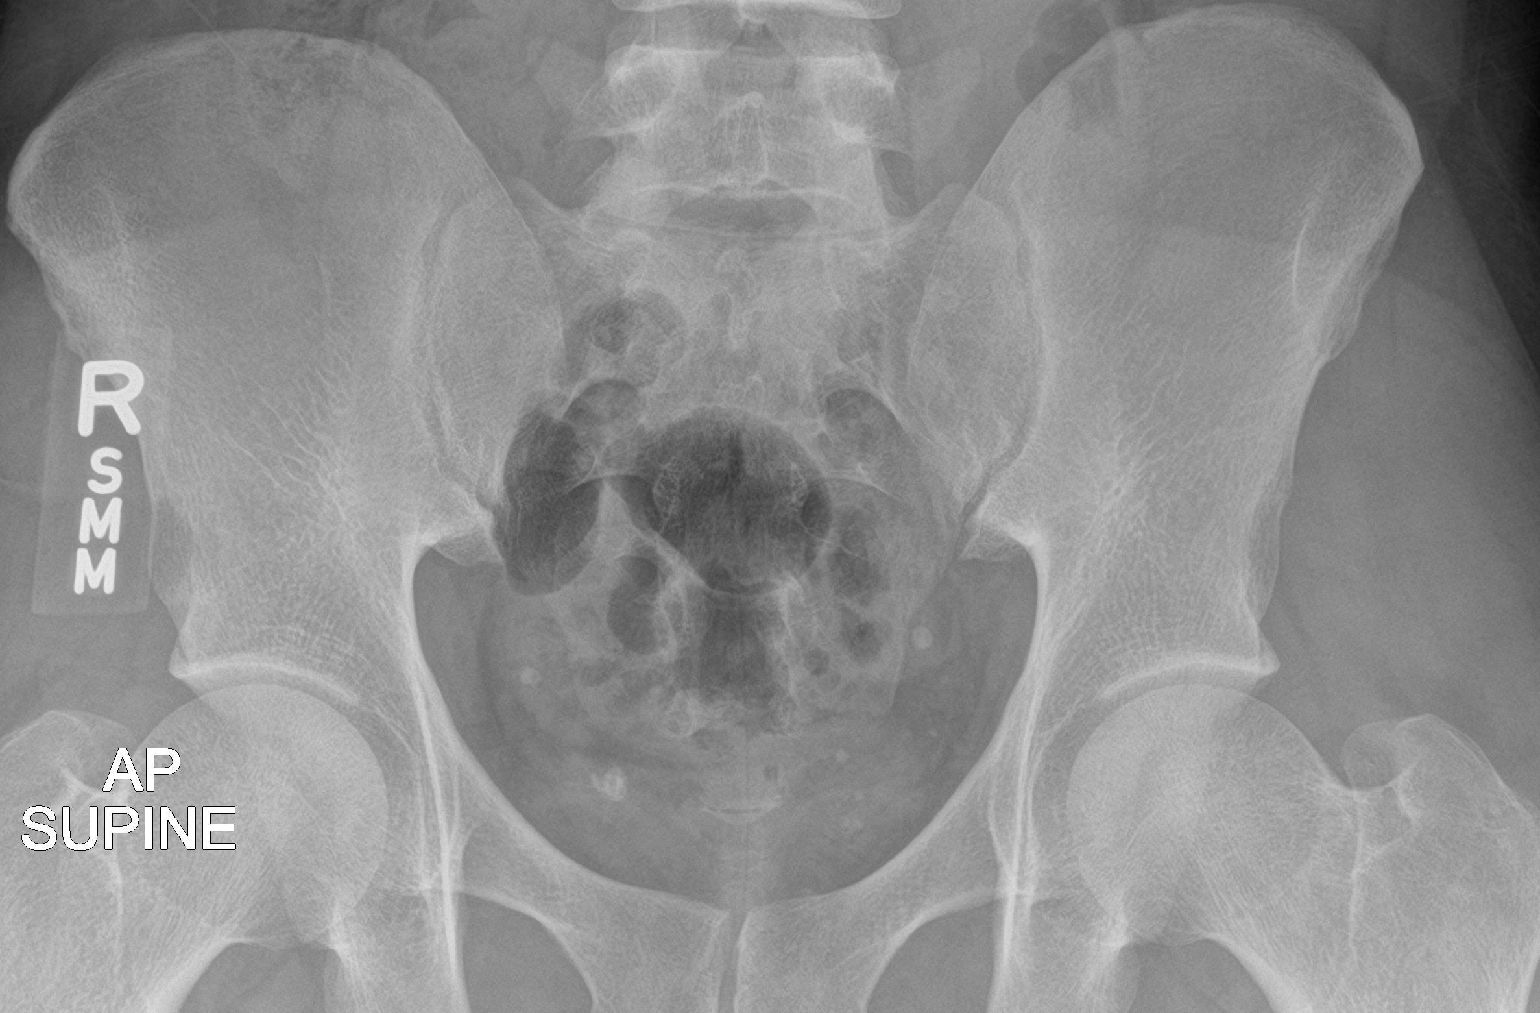

[2 of 2 positions shown; findings below may reference images not displayed]

FINDINGS: No abnormal bowel dilatation is noted. Moderate amount of stool seen
throughout the colon. No radio-opaque calculi or other significant
radiographic abnormality are seen.
IMPRESSION: Moderate stool burden. No evidence of bowel obstruction or ileus.

## 2023-02-13 ENCOUNTER — Ambulatory Visit (HOSPITAL_COMMUNITY)
Admission: EM | Admit: 2023-02-13 | Discharge: 2023-02-13 | Disposition: A | Discharge status: Home / Self Care | Payer: Medicaid Other | Attending: Family Medicine | Admitting: Family Medicine

## 2023-02-13 ENCOUNTER — Other Ambulatory Visit: Payer: Self-pay

## 2023-02-13 ENCOUNTER — Encounter (HOSPITAL_COMMUNITY): Payer: Self-pay | Admitting: Emergency Medicine

## 2023-02-13 DIAGNOSIS — Z202 Contact with and (suspected) exposure to infections with a predominantly sexual mode of transmission: Secondary | ICD-10-CM | POA: Insufficient documentation

## 2023-02-13 LAB — HIV ANTIBODY (ROUTINE TESTING W REFLEX): HIV Screen 4th Generation wRfx: NONREACTIVE

## 2023-02-13 NOTE — ED Triage Notes (Signed)
Patient recently home from prison,  has been seeing someone since getting home and another person reported they have an std.  Patient denies symptoms, but wants to be checked

## 2023-02-13 NOTE — ED Provider Notes (Addendum)
[facility]    CSN: [id number] Arrival date & time: 02/13/23  1356      History   Chief Complaint Chief Complaint  Patient presents with   SEXUALLY TRANSMITTED DISEASE    HPI Marvin Phillips is a 32 y.o. male.   HPI Here for possible exposure to STD.  He has been told by a third-party that the woman he has been dating might have a sexually transmitted disease.  He is not having any discharge or dysuria.  No fever or vomiting  He would like also to have screening for HIV and syphilis.  Past Medical History:  Diagnosis Date   Distal radius fracture, right 02/24/2014   History of asthma    as a child   Metacarpal bone fracture 02/24/2014   right small   Migraines     There are no problems to display for this patient.   Past Surgical History:  Procedure Laterality Date   CLOSED REDUCTION METACARPAL WITH PERCUTANEOUS PINNING Right 03/04/2014   Procedure: CLOSED REDUCTION PERCUTANEOUS PINNING RIGHT SMALL METACARPAL;  Surgeon: Tennis Must, MD;  Location: [facility];  Service: Orthopedics;  Laterality: Right;   EYE MUSCLE SURGERY Left    OPEN REDUCTION INTERNAL FIXATION (ORIF) DISTAL RADIAL FRACTURE Right 03/04/2014   Procedure: OPEN REDUCTION INTERNAL FIXATION (ORIF) DISTAL RADIUS FRACTURE ;  Surgeon: Tennis Must, MD;  Location: [facility];  Service: Orthopedics;  Laterality: Right;       Home Medications    Prior to Admission medications   Medication Sig Start Date End Date Taking? Authorizing Provider  sertraline (ZOLOFT) 50 MG tablet Take 50 mg by mouth daily.   Yes [provider]    Family History Family History  Family history unknown: Yes    Social History Social History   Tobacco Use   Smoking status: Every Day    Packs/day: 0.50    Years: 10.00    Additional pack years: 0.00    Total pack years: 5.00    Types: Cigarettes   Smokeless tobacco: Never  Vaping Use   Vaping Use: Never used   Substance Use Topics   Alcohol use: Yes    Comment: occasionally   Drug use: No     Allergies   Shellfish allergy   Review of Systems Review of Systems   Physical Exam Triage Vital Signs ED Triage Vitals  Enc Vitals Group     BP 02/13/23 1635 118/77     Pulse Rate 02/13/23 1635 73     Resp 02/13/23 1635 18     Temp 02/13/23 1635 98.3 F (36.8 C)     Temp Source 02/13/23 1635 Oral     SpO2 02/13/23 1635 95 %     Weight --      Height --      Head Circumference --      Peak Flow --      Pain Score 02/13/23 1631 0     Pain Loc --      Pain Edu? --      Excl. in [place]? --    No data found.  Updated Vital Signs BP 118/77 (BP Location: Right Arm) Comment (BP Location): large cuff  Pulse 73   Temp 98.3 F (36.8 C) (Oral)   Resp 18   SpO2 95%   Visual Acuity Right Eye Distance:   Left Eye Distance:   Bilateral Distance:    Right Eye Near:  Left Eye Near:    Bilateral Near:     Physical Exam Vitals reviewed.  Constitutional:      General: He is not in acute distress.    Appearance: He is not ill-appearing, toxic-appearing or diaphoretic.  Skin:    Coloration: Skin is not pale.  Neurological:     Mental Status: He is alert and oriented to person, place, and time.  Psychiatric:        Behavior: Behavior normal.      UC Treatments / Results  Labs (all labs ordered are listed, but only abnormal results are displayed) Labs Reviewed  HIV ANTIBODY (ROUTINE TESTING W REFLEX)  RPR  CYTOLOGY, (ORAL, ANAL, URETHRAL) ANCILLARY ONLY    EKG   Radiology No results found.  Procedures Procedures (including critical care time)  Medications Ordered in UC Medications - No data to display  Initial Impression / Assessment and Plan / UC Course  I have reviewed the triage vital signs and the nursing notes.  Pertinent labs & imaging results that were available during my care of the patient were reviewed by me and considered in my medical decision making (see  chart for details).        Urethral self swab is done and labs drawn for HIV and RPR testing.  Staff will notify him and treat per protocol any positives Final Clinical Impressions(s) / UC Diagnoses   Final diagnoses:  Exposure to STD     Discharge Instructions      Staff will notify you if anything is positive on your swab or on your blood work     ED Prescriptions   None    PDMP not reviewed this encounter.   Barrett Henle, MD 02/13/23 1650    Barrett Henle, MD 02/13/23 [phone]

## 2023-02-13 NOTE — Discharge Instructions (Addendum)
Staff will notify you if anything is positive on your swab or on your blood work

## 2023-02-14 LAB — RPR: RPR Ser Ql: NONREACTIVE

## 2023-02-16 ENCOUNTER — Encounter (HOSPITAL_COMMUNITY): Payer: Self-pay

## 2023-02-16 ENCOUNTER — Ambulatory Visit (HOSPITAL_COMMUNITY)
Admission: EM | Admit: 2023-02-16 | Discharge: 2023-02-16 | Disposition: A | Discharge status: Home / Self Care | Payer: Self-pay | Attending: Internal Medicine | Admitting: Internal Medicine

## 2023-02-16 DIAGNOSIS — A549 Gonococcal infection, unspecified: Secondary | ICD-10-CM

## 2023-02-16 LAB — CYTOLOGY, (ORAL, ANAL, URETHRAL) ANCILLARY ONLY
Chlamydia: POSITIVE — AB
Comment: NEGATIVE
Comment: NEGATIVE
Comment: NORMAL
Neisseria Gonorrhea: POSITIVE — AB
Trichomonas: NEGATIVE

## 2023-02-16 MED ORDER — DOXYCYCLINE HYCLATE 100 MG PO CAPS: 100.0000 mg | ORAL_CAPSULE | Freq: Two times a day (BID) | ORAL | 0 refills | Status: AC

## 2023-02-16 MED ORDER — CEFTRIAXONE SODIUM 500 MG IJ SOLR
500.0000 mg | Freq: Once | INTRAMUSCULAR | Status: AC
  Administered 2023-02-16: 500 mg via INTRAMUSCULAR

## 2023-02-16 MED ORDER — CEFTRIAXONE SODIUM 500 MG IJ SOLR
INTRAMUSCULAR | Status: AC
  Filled 2023-02-16: qty 500

## 2023-02-16 NOTE — ED Triage Notes (Signed)
Pt is here for STD-treatment. Pt tested positive for GC/CH. Per Protocol- pt will need to be treated with Rocephin 500 IM and Doxy 100 mg BID X 7 days sent to patient pharmacy.  Pt advised to notified his partners of STD, refrain from any sexual intercourse for 7-10 days until all parties have been treated. Pt voiced understanding at this time.

## 2023-02-16 NOTE — ED Provider Notes (Signed)
Pt here for STD tx. Given rocephin here; I have sent in his doxy for the chlamydia   Barrett Henle, MD 02/16/23 1650

## 2023-07-03 ENCOUNTER — Other Ambulatory Visit: Payer: Self-pay

## 2023-07-03 ENCOUNTER — Encounter (HOSPITAL_COMMUNITY): Payer: Self-pay | Admitting: Emergency Medicine

## 2023-07-03 ENCOUNTER — Ambulatory Visit (HOSPITAL_COMMUNITY)
Admission: EM | Admit: 2023-07-03 | Discharge: 2023-07-03 | Disposition: A | Discharge status: Home / Self Care | Payer: Self-pay | Attending: Physician Assistant | Admitting: Physician Assistant

## 2023-07-03 DIAGNOSIS — K0889 Other specified disorders of teeth and supporting structures: Secondary | ICD-10-CM | POA: Insufficient documentation

## 2023-07-03 DIAGNOSIS — Z113 Encounter for screening for infections with a predominantly sexual mode of transmission: Secondary | ICD-10-CM | POA: Insufficient documentation

## 2023-07-03 LAB — HIV ANTIBODY (ROUTINE TESTING W REFLEX): HIV Screen 4th Generation wRfx: NONREACTIVE

## 2023-07-03 MED ORDER — AMOXICILLIN 500 MG PO CAPS: 500.0000 mg | ORAL_CAPSULE | Freq: Three times a day (TID) | ORAL | 0 refills | Status: DC

## 2023-07-03 NOTE — ED Triage Notes (Signed)
Patient reports dental pain and swelling for a week.  Reports a tooth with cavity on top, right.    Patient also requesting STD evaluation.  Denies symptoms

## 2023-07-03 NOTE — ED Provider Notes (Signed)
[facility]    CSN: [id number] Arrival date & time: 07/03/23  1538      History   Chief Complaint Chief Complaint  Patient presents with   Dental Pain    HPI Marvin Phillips is a 32 y.o. male.   Patient here today for evaluation of dental pain and swelling that is been ongoing for the last week.  He states that he has a cavity in a tooth to his right upper molars.  He does not currently have a dentist.  He has tried ibuprofen and OTC pain reliever without resolution.  He denies any fever.  He also requests STD screening.  He denies any current symptoms.  The history is provided by the patient.  Dental Pain Associated symptoms: no fever     Past Medical History:  Diagnosis Date   Distal radius fracture, right 02/24/2014   History of asthma    as a child   Metacarpal bone fracture 02/24/2014   right small   Migraines     There are no problems to display for this patient.   Past Surgical History:  Procedure Laterality Date   CLOSED REDUCTION METACARPAL WITH PERCUTANEOUS PINNING Right 03/04/2014   Procedure: CLOSED REDUCTION PERCUTANEOUS PINNING RIGHT SMALL METACARPAL;  Surgeon: Tami Ribas, MD;  Location: [facility];  Service: Orthopedics;  Laterality: Right;   EYE MUSCLE SURGERY Left    OPEN REDUCTION INTERNAL FIXATION (ORIF) DISTAL RADIAL FRACTURE Right 03/04/2014   Procedure: OPEN REDUCTION INTERNAL FIXATION (ORIF) DISTAL RADIUS FRACTURE ;  Surgeon: Tami Ribas, MD;  Location: [facility];  Service: Orthopedics;  Laterality: Right;       Home Medications    Prior to Admission medications   Medication Sig Start Date End Date Taking? Authorizing Provider  amoxicillin (AMOXIL) 500 MG capsule Take 1 capsule (500 mg total) by mouth 3 (three) times daily. 07/03/23  Yes Tomi Bamberger, PA-C  sertraline (ZOLOFT) 50 MG tablet Take 50 mg by mouth daily.    [provider]    Family History Family History  Family  history unknown: Yes    Social History Social History   Tobacco Use   Smoking status: Every Day    Current packs/day: 0.50    Average packs/day: 0.5 packs/day for 10.0 years (5.0 ttl pk-yrs)    Types: Cigarettes   Smokeless tobacco: Never  Vaping Use   Vaping status: Never Used  Substance Use Topics   Alcohol use: Yes    Comment: occasionally   Drug use: Yes    Types: Marijuana     Allergies   Shellfish allergy   Review of Systems Review of Systems  Constitutional:  Negative for chills and fever.  HENT:  Positive for dental problem.   Eyes:  Negative for discharge and redness.  Respiratory:  Negative for shortness of breath.   Gastrointestinal:  Negative for nausea and vomiting.  Genitourinary:  Negative for genital sores and penile discharge.  Neurological:  Negative for numbness.     Physical Exam Triage Vital Signs ED Triage Vitals  Encounter Vitals Group     BP 07/03/23 1732 134/88     Systolic BP Percentile --      Diastolic BP Percentile --      Pulse Rate 07/03/23 1732 60     Resp 07/03/23 1732 20     Temp 07/03/23 1732 97.9 F (36.6 C)     Temp Source 07/03/23 1732 Oral  SpO2 07/03/23 1732 97 %     Weight --      Height --      Head Circumference --      Peak Flow --      Pain Score 07/03/23 1731 10     Pain Loc --      Pain Education --      Exclude from Growth Chart --    No data found.  Updated Vital Signs BP 134/88 (BP Location: Right Arm)   Pulse 60   Temp 97.9 F (36.6 C) (Oral)   Resp 20   SpO2 97%      Physical Exam Vitals and nursing note reviewed.  Constitutional:      General: He is not in acute distress.    Appearance: Normal appearance. He is not ill-appearing.  HENT:     Head: Normocephalic and atraumatic.     Mouth/Throat:     Comments: Diffuse swelling to gumline of right upper molars with multiple caries noted.  Mild swelling appreciated to right lateral maxillary area Eyes:     Conjunctiva/sclera:  Conjunctivae normal.  Cardiovascular:     Rate and Rhythm: Normal rate.  Pulmonary:     Effort: Pulmonary effort is normal.  Neurological:     Mental Status: He is alert.  Psychiatric:        Mood and Affect: Mood normal.        Behavior: Behavior normal.        Thought Content: Thought content normal.      UC Treatments / Results  Labs (all labs ordered are listed, but only abnormal results are displayed) Labs Reviewed  HIV ANTIBODY (ROUTINE TESTING W REFLEX)  RPR  CYTOLOGY, (ORAL, ANAL, URETHRAL) ANCILLARY ONLY    EKG   Radiology No results found.  Procedures Procedures (including critical care time)  Medications Ordered in UC Medications - No data to display  Initial Impression / Assessment and Plan / UC Course  I have reviewed the triage vital signs and the nursing notes.  Pertinent labs & imaging results that were available during my care of the patient were reviewed by me and considered in my medical decision making (see chart for details).    Amoxicillin prescribed for suspected abscess.  Recommended follow-up with dentistry and list provided for same.  STD screening ordered as requested.  Encouraged follow-up with any further concerns or worsening/persistent symptoms.  Final Clinical Impressions(s) / UC Diagnoses   Final diagnoses:  Pain, dental  Screening for STD (sexually transmitted disease)   Discharge Instructions   None    ED Prescriptions     Medication Sig Dispense Auth. Provider   amoxicillin (AMOXIL) 500 MG capsule Take 1 capsule (500 mg total) by mouth 3 (three) times daily. 21 capsule Tomi Bamberger, PA-C      PDMP not reviewed this encounter.   Tomi Bamberger, PA-C 07/03/23 1755

## 2023-07-04 LAB — CYTOLOGY, (ORAL, ANAL, URETHRAL) ANCILLARY ONLY
Chlamydia: NEGATIVE
Comment: NEGATIVE
Comment: NEGATIVE
Comment: NORMAL
Neisseria Gonorrhea: NEGATIVE
Trichomonas: POSITIVE — AB

## 2023-07-04 LAB — RPR: RPR Ser Ql: NONREACTIVE

## 2023-07-05 ENCOUNTER — Telehealth: Payer: Self-pay

## 2023-07-05 MED ORDER — METRONIDAZOLE 500 MG PO TABS: 2000.0000 mg | ORAL_TABLET | Freq: Once | ORAL | 0 refills | Status: AC

## 2023-07-05 NOTE — Telephone Encounter (Signed)
 Contacted patient by phone.  Verified identity using two identifiers.  Provided positive result.  Reviewed safe sex practices, notifying partners, and refraining from sexual activities for 7 days from time of treatment.  Patient verified understanding, all questions answered.     Per protocol, pt requires tx with metronidazole. Reviewed with patient, verified pharmacy, prescription sent.

## 2024-01-03 ENCOUNTER — Encounter (HOSPITAL_COMMUNITY): Payer: Self-pay

## 2024-01-03 ENCOUNTER — Ambulatory Visit (HOSPITAL_COMMUNITY)
Admission: EM | Admit: 2024-01-03 | Discharge: 2024-01-03 | Disposition: A | Discharge status: Home / Self Care | Payer: Medicaid Other | Attending: Emergency Medicine | Admitting: Emergency Medicine

## 2024-01-03 DIAGNOSIS — Z202 Contact with and (suspected) exposure to infections with a predominantly sexual mode of transmission: Secondary | ICD-10-CM | POA: Diagnosis present

## 2024-01-03 DIAGNOSIS — L0501 Pilonidal cyst with abscess: Secondary | ICD-10-CM | POA: Insufficient documentation

## 2024-01-03 MED ORDER — LIDOCAINE-EPINEPHRINE-TETRACAINE (LET) TOPICAL GEL
TOPICAL | Status: AC
  Filled 2024-01-03: qty 3

## 2024-01-03 MED ORDER — DOXYCYCLINE HYCLATE 100 MG PO CAPS: 100.0000 mg | ORAL_CAPSULE | Freq: Two times a day (BID) | ORAL | 0 refills | Status: DC

## 2024-01-03 MED ORDER — LIDOCAINE-EPINEPHRINE-TETRACAINE (LET) TOPICAL GEL
3.0000 mL | Freq: Once | TOPICAL | Status: AC
  Administered 2024-01-03: 3 mL via TOPICAL

## 2024-01-03 NOTE — ED Provider Notes (Addendum)
[facility]    CSN: [id number] Arrival date & time: 01/03/24  1040      History   Chief Complaint Chief Complaint  Patient presents with   Exposure to STD   Cyst    HPI Marvin Phillips is a 33 y.o. male.   Patient presents requesting STD testing due to recent exposure to chlamydia. Denies dysuria, urinary frequency/urgency, and penile discharge.   Patient also reports noticing a cyst to the top of the gluteal cleft x 2 days. Endorses pain and tenderness. Denies drainage and fever.    Exposure to STD    Past Medical History:  Diagnosis Date   Distal radius fracture, right 02/24/2014   History of asthma    as a child   Metacarpal bone fracture 02/24/2014   right small   Migraines     There are no active problems to display for this patient.   Past Surgical History:  Procedure Laterality Date   CLOSED REDUCTION METACARPAL WITH PERCUTANEOUS PINNING Right 03/04/2014   Procedure: CLOSED REDUCTION PERCUTANEOUS PINNING RIGHT SMALL METACARPAL;  Surgeon: Tami Ribas, MD;  Location: [facility];  Service: Orthopedics;  Laterality: Right;   EYE MUSCLE SURGERY Left    OPEN REDUCTION INTERNAL FIXATION (ORIF) DISTAL RADIAL FRACTURE Right 03/04/2014   Procedure: OPEN REDUCTION INTERNAL FIXATION (ORIF) DISTAL RADIUS FRACTURE ;  Surgeon: Tami Ribas, MD;  Location:  SUR[facility]ERY C[facility];  Service: Orthopedics;  Laterality: Right;       Home Medications    Prior to Admission medications   Medication Sig Start Date End Date Taking? Authorizing Provider  doxycycline (VIBRAMYCIN) 100 MG capsule Take 1 capsule (100 mg total) by mouth 2 (two) times daily. 01/03/24  Yes Susann Givens, Deaveon Schoen A, NP  sertraline (ZOLOFT) 50 MG tablet Take 50 mg by mouth daily.   Yes [provider]    Family History Family History  Family history unknown: Yes    Social History Social History   Tobacco Use   Smoking status: Some Days    Current packs/day:  0.50    Average packs/day: 0.5 packs/day for 10.0 years (5.0 ttl pk-yrs)    Types: Cigarettes   Smokeless tobacco: Never  Vaping Use   Vaping status: Never Used  Substance Use Topics   Alcohol use: Yes    Comment: occasionally   Drug use: Yes    Types: Marijuana     Allergies   Shellfish allergy and Other   Review of Systems Review of Systems  Per HPI  Physical Exam Triage Vital Signs ED Triage Vitals  Encounter Vitals Group     BP 01/03/24 1211 111/77     Systolic BP Percentile --      Diastolic BP Percentile --      Pulse Rate 01/03/24 1211 78     Resp 01/03/24 1211 18     Temp 01/03/24 1211 98.1 F (36.7 C)     Temp Source 01/03/24 1211 Oral     SpO2 01/03/24 1211 98 %     Weight --      Height --      Head Circumference --      Peak Flow --      Pain Score 01/03/24 1205 6     Pain Loc --      Pain Education --      Exclude from Growth Chart --    No data found.  Updated Vital Signs BP 111/77 (BP Location:  Left Arm)   Pulse 78   Temp 98.1 F (36.7 C) (Oral)   Resp 18   SpO2 98%   Visual Acuity Right Eye Distance:   Left Eye Distance:   Bilateral Distance:    Right Eye Near:   Left Eye Near:    Bilateral Near:     Physical Exam Vitals and nursing note reviewed.  Constitutional:      General: He is awake. He is not in acute distress.    Appearance: Normal appearance. He is well-developed and well-groomed. He is not ill-appearing.  Genitourinary:    Comments: Exam deferred.  Skin:    General: Skin is warm and dry.     Findings: Abscess present.     Comments: Pilonidal abscess noted to upper gluteal cleft.   Neurological:     Mental Status: He is alert.  Psychiatric:        Behavior: Behavior is cooperative.      UC Treatments / Results  Labs (all labs ordered are listed, but only abnormal results are displayed) Labs Reviewed  CYTOLOGY, (ORAL, ANAL, URETHRAL) ANCILLARY ONLY    EKG   Radiology No results  found.  Procedures Incision and Drainage  Date/Time: 01/03/2024 1:42 PM  Performed by: Letta Kocher, NP Authorized by: Letta Kocher, NP   Consent:    Consent obtained:  Verbal   Consent given by:  Patient   Risks, benefits, and alternatives were discussed: yes     Risks discussed:  Bleeding, incomplete drainage, damage to other organs and infection   Alternatives discussed:  Alternative treatment, observation, no treatment and delayed treatment Universal protocol:    Procedure explained and questions answered to patient or proxy's satisfaction: yes     Relevant documents present and verified: yes     Test results available : no     Imaging studies available: no     Required blood products, implants, devices, and special equipment available: no     Site/side marked: no     Immediately prior to procedure, a time out was called: yes     Patient identity confirmed:  Verbally with patient and arm band Location:    Type:  Pilonidal cyst   Size:  3 cm Pre-procedure details:    Skin preparation:  Chlorhexidine with alcohol and povidone-iodine Sedation:    Sedation type:  None Anesthesia:    Anesthesia method:  None Procedure type:    Complexity:  Simple Procedure details:    Ultrasound guidance: no     Needle aspiration: no     Incision types:  Stab incision   Incision depth:  Dermal   Drainage:  Bloody and purulent   Drainage amount:  Moderate   Wound treatment:  Wound left open   Packing materials:  None Post-procedure details:    Procedure completion:  Tolerated well, no immediate complications  (including critical care time)  Medications Ordered in UC Medications  lidocaine-EPINEPHrine-tetracaine (LET) topical gel (3 mLs Topical Given 01/03/24 1307)    Initial Impression / Assessment and Plan / UC Course  I have reviewed the triage vital signs and the nursing notes.  Pertinent labs & imaging results that were available during my care of the patient were  reviewed by me and considered in my medical decision making (see chart for details).     Patient presented requesting STD testing due to recent exposure to chlamydia. Denies any symptoms. GU exam deferred due to lack of symptoms. Patient performed self-swab for  STD. Declined HIV and syphilis testing.   Patient also reported also noticing a cyst to his gluteal cleft x 2 days. Upon assessment pilonidal abscess noted to upper gluteal cleft. I&D performed with chaperone present as documented above under procedure. Applied LET prior to procedure.  Prescribed doxycycline for chlamydia and abscess coverage. Discussed return precautions.  Final Clinical Impressions(s) / UC Diagnoses   Final diagnoses:  STD exposure  Pilonidal abscess     Discharge Instructions      Today we did an incision and drainage of your abscess. This will continue to drain over the next few days. You can sit in a warm bath or apply warm compresses to help with continued drainage as well.   I have started you on doxycycline for chlamydia exposure. This antibiotic can also coverage for infection related to abscess. If you notice worsening pain, swelling, or develop a fever please return here for re-evaluation.   Your results will come back over the next few days and someone will call if results are positive and require treatment.  Return here as needed.     ED Prescriptions     Medication Sig Dispense Auth. Provider   doxycycline (VIBRAMYCIN) 100 MG capsule Take 1 capsule (100 mg total) by mouth 2 (two) times daily. 20 capsule Wynonia Lawman A, NP      PDMP not reviewed this encounter.   Letta Kocher, NP 01/03/24 1349    Wynonia Lawman A, NP 01/03/24 1349

## 2024-01-03 NOTE — ED Triage Notes (Signed)
Pt states his ex partner informed him that she had chlamydia and he wants to get tested. Pt denies any urinary or STD symptoms.   Pt also states he has a cyst causing pressure at the top of his gluteal cleft that started two days ago.

## 2024-01-03 NOTE — Discharge Instructions (Addendum)
Today we did an incision and drainage of your abscess. This will continue to drain over the next few days. You can sit in a warm bath or apply warm compresses to help with continued drainage as well.   I have started you on doxycycline for chlamydia exposure. This antibiotic can also coverage for infection related to abscess. If you notice worsening pain, swelling, or develop a fever please return here for re-evaluation.   Your results will come back over the next few days and someone will call if results are positive and require treatment.  Return here as needed.

## 2024-01-04 LAB — CYTOLOGY, (ORAL, ANAL, URETHRAL) ANCILLARY ONLY
Chlamydia: POSITIVE — AB
Comment: NEGATIVE
Comment: NEGATIVE
Comment: NORMAL
Neisseria Gonorrhea: NEGATIVE
Trichomonas: NEGATIVE

## 2024-09-14 ENCOUNTER — Inpatient Hospital Stay (HOSPITAL_COMMUNITY)

## 2024-09-14 ENCOUNTER — Inpatient Hospital Stay (HOSPITAL_COMMUNITY): Admitting: Certified Registered Nurse Anesthetist

## 2024-09-14 ENCOUNTER — Emergency Department (HOSPITAL_COMMUNITY)

## 2024-09-14 ENCOUNTER — Encounter (HOSPITAL_COMMUNITY): Payer: Self-pay | Admitting: Emergency Medicine

## 2024-09-14 ENCOUNTER — Inpatient Hospital Stay (HOSPITAL_COMMUNITY)
Admission: EM | Admit: 2024-09-14 | Discharge: 2024-09-17 | DRG: 481 | Disposition: A | Discharge status: Home / Self Care | Attending: Orthopedic Surgery | Admitting: Orthopedic Surgery

## 2024-09-14 ENCOUNTER — Other Ambulatory Visit: Payer: Self-pay

## 2024-09-14 ENCOUNTER — Encounter (HOSPITAL_COMMUNITY): Admission: EM | Disposition: A | Payer: Self-pay | Source: Home / Self Care | Attending: Orthopedic Surgery

## 2024-09-14 DIAGNOSIS — F17210 Nicotine dependence, cigarettes, uncomplicated: Secondary | ICD-10-CM | POA: Diagnosis present

## 2024-09-14 DIAGNOSIS — Z79899 Other long term (current) drug therapy: Secondary | ICD-10-CM

## 2024-09-14 DIAGNOSIS — Z91018 Allergy to other foods: Secondary | ICD-10-CM

## 2024-09-14 DIAGNOSIS — E872 Acidosis, unspecified: Secondary | ICD-10-CM | POA: Diagnosis present

## 2024-09-14 DIAGNOSIS — Z91013 Allergy to seafood: Secondary | ICD-10-CM

## 2024-09-14 DIAGNOSIS — Z8781 Personal history of (healed) traumatic fracture: Secondary | ICD-10-CM

## 2024-09-14 DIAGNOSIS — S61531A Puncture wound without foreign body of right wrist, initial encounter: Secondary | ICD-10-CM | POA: Diagnosis present

## 2024-09-14 DIAGNOSIS — Z23 Encounter for immunization: Secondary | ICD-10-CM

## 2024-09-14 DIAGNOSIS — S72301A Unspecified fracture of shaft of right femur, initial encounter for closed fracture: Secondary | ICD-10-CM

## 2024-09-14 DIAGNOSIS — Y249XXA Unspecified firearm discharge, undetermined intent, initial encounter: Principal | ICD-10-CM

## 2024-09-14 DIAGNOSIS — F10129 Alcohol abuse with intoxication, unspecified: Secondary | ICD-10-CM | POA: Diagnosis present

## 2024-09-14 DIAGNOSIS — S72351B Displaced comminuted fracture of shaft of right femur, initial encounter for open fracture type I or II: Principal | ICD-10-CM | POA: Diagnosis present

## 2024-09-14 DIAGNOSIS — Z9109 Other allergy status, other than to drugs and biological substances: Secondary | ICD-10-CM

## 2024-09-14 DIAGNOSIS — F4310 Post-traumatic stress disorder, unspecified: Secondary | ICD-10-CM | POA: Diagnosis present

## 2024-09-14 DIAGNOSIS — Z91030 Bee allergy status: Secondary | ICD-10-CM

## 2024-09-14 DIAGNOSIS — S72301B Unspecified fracture of shaft of right femur, initial encounter for open fracture type I or II: Secondary | ICD-10-CM | POA: Diagnosis present

## 2024-09-14 DIAGNOSIS — S7291XA Unspecified fracture of right femur, initial encounter for closed fracture: Secondary | ICD-10-CM | POA: Diagnosis present

## 2024-09-14 DIAGNOSIS — S31814A Puncture wound with foreign body of right buttock, initial encounter: Secondary | ICD-10-CM | POA: Diagnosis present

## 2024-09-14 HISTORY — PX: FEMUR IM NAIL: SHX1597

## 2024-09-14 LAB — URINALYSIS, ROUTINE W REFLEX MICROSCOPIC
Bacteria, UA: NONE SEEN
Bilirubin Urine: NEGATIVE
Glucose, UA: NEGATIVE mg/dL
Ketones, ur: NEGATIVE mg/dL
Leukocytes,Ua: NEGATIVE
Nitrite: NEGATIVE
Protein, ur: NEGATIVE mg/dL
Specific Gravity, Urine: 1.045 — ABNORMAL HIGH (ref 1.005–1.030)
pH: 5 (ref 5.0–8.0)

## 2024-09-14 LAB — COMPREHENSIVE METABOLIC PANEL WITH GFR
ALT: 16 U/L (ref 0–44)
AST: 22 U/L (ref 15–41)
Albumin: 3.8 g/dL (ref 3.5–5.0)
Alkaline Phosphatase: 56 U/L (ref 38–126)
Anion gap: 17 — ABNORMAL HIGH (ref 5–15)
BUN: 6 mg/dL (ref 6–20)
CO2: 17 mmol/L — ABNORMAL LOW (ref 22–32)
Calcium: 8.8 mg/dL — ABNORMAL LOW (ref 8.9–10.3)
Chloride: 105 mmol/L (ref 98–111)
Creatinine, Ser: 1.22 mg/dL (ref 0.61–1.24)
GFR, Estimated: >60 mL/min (ref >60)
Glucose, Bld: 125 mg/dL — ABNORMAL HIGH (ref 70–99)
Potassium: 3.1 mmol/L — ABNORMAL LOW (ref 3.5–5.1)
Sodium: 139 mmol/L (ref 135–145)
Total Bilirubin: 0.6 mg/dL (ref 0.0–1.2)
Total Protein: 7.4 g/dL (ref 6.5–8.1)

## 2024-09-14 LAB — I-STAT CHEM 8, ED
BUN: 6 mg/dL (ref 6–20)
Calcium, Ion: 1.08 mmol/L — ABNORMAL LOW (ref 1.15–1.40)
Chloride: 105 mmol/L (ref 98–111)
Creatinine, Ser: 1.3 mg/dL — ABNORMAL HIGH (ref 0.61–1.24)
Glucose, Bld: 121 mg/dL — ABNORMAL HIGH (ref 70–99)
HCT: 48 % (ref 39.0–52.0)
Hemoglobin: 16.3 g/dL (ref 13.0–17.0)
Potassium: 3.2 mmol/L — ABNORMAL LOW (ref 3.5–5.1)
Sodium: 142 mmol/L (ref 135–145)
TCO2: 18 mmol/L — ABNORMAL LOW (ref 22–32)

## 2024-09-14 LAB — CBC
HCT: 45.1 % (ref 39.0–52.0)
Hemoglobin: 15.7 g/dL (ref 13.0–17.0)
MCH: 33.7 pg (ref 26.0–34.0)
MCHC: 34.8 g/dL (ref 30.0–36.0)
MCV: 96.8 fL (ref 80.0–100.0)
Platelets: 206 K/uL (ref 150–400)
RBC: 4.66 MIL/uL (ref 4.22–5.81)
RDW: 13.1 % (ref 11.5–15.5)
WBC: 17.8 K/uL — ABNORMAL HIGH (ref 4.0–10.5)
nRBC: 0 % (ref 0.0–0.2)

## 2024-09-14 LAB — PROTIME-INR
INR: 1 (ref 0.8–1.2)
Prothrombin Time: 14 s (ref 11.4–15.2)

## 2024-09-14 LAB — SAMPLE TO BLOOD BANK

## 2024-09-14 LAB — I-STAT CG4 LACTIC ACID, ED: Lactic Acid, Venous: 4.5 mmol/L (ref 0.5–1.9)

## 2024-09-14 LAB — GLUCOSE, CAPILLARY: Glucose-Capillary: 114 mg/dL — ABNORMAL HIGH (ref 70–99)

## 2024-09-14 LAB — SURGICAL PCR SCREEN
MRSA, PCR: NEGATIVE
Staphylococcus aureus: NEGATIVE

## 2024-09-14 LAB — ETHANOL: Alcohol, Ethyl (B): 142 mg/dL — ABNORMAL HIGH (ref <15)

## 2024-09-14 SURGERY — INSERTION, INTRAMEDULLARY ROD, FEMUR
Anesthesia: General | Site: Leg Upper | Laterality: Right

## 2024-09-14 MED ORDER — TETANUS-DIPHTH-ACELL PERTUSSIS 5-2-15.5 LF-MCG/0.5 IM SUSP
0.5000 mL | Freq: Once | INTRAMUSCULAR | Status: AC
  Administered 2024-09-14: 0.5 mL via INTRAMUSCULAR
  Filled 2024-09-14: qty 0.5

## 2024-09-14 MED ORDER — ORAL CARE MOUTH RINSE: 15.0000 mL | Freq: Once | OROMUCOSAL | Status: AC

## 2024-09-14 MED ORDER — POVIDONE-IODINE 10 % EX SWAB: 2.0000 | Freq: Once | CUTANEOUS | Status: DC

## 2024-09-14 MED ORDER — HYDROMORPHONE HCL 1 MG/ML IJ SOLN
INTRAMUSCULAR | Status: AC
  Filled 2024-09-14: qty 1

## 2024-09-14 MED ORDER — METOCLOPRAMIDE HCL 5 MG/ML IJ SOLN: 5.0000 mg | Freq: Three times a day (TID) | INTRAMUSCULAR | Status: DC | PRN

## 2024-09-14 MED ORDER — ACETAMINOPHEN 10 MG/ML IV SOLN
INTRAVENOUS | Status: AC
  Filled 2024-09-14: qty 100

## 2024-09-14 MED ORDER — PROPOFOL 10 MG/ML IV BOLUS
INTRAVENOUS | Status: DC | PRN
  Administered 2024-09-14: 170 mg via INTRAVENOUS

## 2024-09-14 MED ORDER — METHOCARBAMOL 1000 MG/10ML IJ SOLN: 500.0000 mg | Freq: Four times a day (QID) | INTRAMUSCULAR | Status: DC | PRN

## 2024-09-14 MED ORDER — HYDROMORPHONE HCL 1 MG/ML IJ SOLN
0.5000 mg | INTRAMUSCULAR | Status: DC | PRN
  Administered 2024-09-14 – 2024-09-17 (×10): 1 mg via INTRAVENOUS
  Filled 2024-09-14 (×10): qty 1

## 2024-09-14 MED ORDER — SODIUM CHLORIDE 0.9 % IV SOLN: INTRAVENOUS | Status: AC

## 2024-09-14 MED ORDER — MENTHOL 3 MG MT LOZG: 1.0000 | LOZENGE | OROMUCOSAL | Status: DC | PRN

## 2024-09-14 MED ORDER — CEFAZOLIN SODIUM-DEXTROSE 2-4 GM/100ML-% IV SOLN: 2.0000 g | Freq: Four times a day (QID) | INTRAVENOUS | Status: DC

## 2024-09-14 MED ORDER — AMISULPRIDE (ANTIEMETIC) 5 MG/2ML IV SOLN: 10.0000 mg | Freq: Once | INTRAVENOUS | Status: DC | PRN

## 2024-09-14 MED ORDER — ROCURONIUM BROMIDE 10 MG/ML (PF) SYRINGE
PREFILLED_SYRINGE | INTRAVENOUS | Status: AC
  Filled 2024-09-14: qty 10

## 2024-09-14 MED ORDER — MIDAZOLAM HCL 2 MG/2ML IJ SOLN
INTRAMUSCULAR | Status: AC
  Filled 2024-09-14: qty 2

## 2024-09-14 MED ORDER — MIDAZOLAM HCL (PF) 2 MG/2ML IJ SOLN
INTRAMUSCULAR | Status: DC | PRN
  Administered 2024-09-14: 2 mg via INTRAVENOUS

## 2024-09-14 MED ORDER — ONDANSETRON HCL 4 MG/2ML IJ SOLN: 4.0000 mg | Freq: Four times a day (QID) | INTRAMUSCULAR | Status: DC | PRN

## 2024-09-14 MED ORDER — LIDOCAINE 2% (20 MG/ML) 5 ML SYRINGE
INTRAMUSCULAR | Status: DC | PRN
  Administered 2024-09-14: 80 mg via INTRAVENOUS

## 2024-09-14 MED ORDER — TRANEXAMIC ACID-NACL 1000-0.7 MG/100ML-% IV SOLN
1000.0000 mg | Freq: Once | INTRAVENOUS | Status: AC
  Administered 2024-09-14: 1000 mg via INTRAVENOUS
  Filled 2024-09-14: qty 100

## 2024-09-14 MED ORDER — HYDROMORPHONE HCL 1 MG/ML IJ SOLN
1.0000 mg | Freq: Once | INTRAMUSCULAR | Status: AC
  Administered 2024-09-14: 1 mg via INTRAVENOUS
  Filled 2024-09-14: qty 1

## 2024-09-14 MED ORDER — CEFAZOLIN SODIUM-DEXTROSE 2-4 GM/100ML-% IV SOLN
2.0000 g | Freq: Once | INTRAVENOUS | Status: AC
  Administered 2024-09-14: 2 g via INTRAVENOUS
  Filled 2024-09-14: qty 100

## 2024-09-14 MED ORDER — ACETAMINOPHEN 500 MG PO TABS
1000.0000 mg | ORAL_TABLET | Freq: Four times a day (QID) | ORAL | Status: AC
  Administered 2024-09-14 – 2024-09-15 (×4): 1000 mg via ORAL
  Filled 2024-09-14 (×5): qty 2

## 2024-09-14 MED ORDER — TRANEXAMIC ACID-NACL 1000-0.7 MG/100ML-% IV SOLN
1000.0000 mg | INTRAVENOUS | Status: AC
  Administered 2024-09-14: 1000 mg via INTRAVENOUS

## 2024-09-14 MED ORDER — METHOCARBAMOL 500 MG PO TABS
500.0000 mg | ORAL_TABLET | Freq: Four times a day (QID) | ORAL | Status: DC | PRN
  Administered 2024-09-14 – 2024-09-17 (×5): 500 mg via ORAL
  Filled 2024-09-14 (×5): qty 1

## 2024-09-14 MED ORDER — IOHEXOL 350 MG/ML SOLN
150.0000 mL | Freq: Once | INTRAVENOUS | Status: AC | PRN
  Administered 2024-09-14: 150 mL via INTRAVENOUS

## 2024-09-14 MED ORDER — ONDANSETRON HCL 4 MG PO TABS: 4.0000 mg | ORAL_TABLET | Freq: Four times a day (QID) | ORAL | Status: DC | PRN

## 2024-09-14 MED ORDER — ONDANSETRON HCL 4 MG/2ML IJ SOLN
INTRAMUSCULAR | Status: AC
  Filled 2024-09-14: qty 2

## 2024-09-14 MED ORDER — DEXAMETHASONE SOD PHOSPHATE PF 10 MG/ML IJ SOLN
INTRAMUSCULAR | Status: DC | PRN
  Administered 2024-09-14: 10 mg via INTRAVENOUS

## 2024-09-14 MED ORDER — ONDANSETRON HCL 4 MG/2ML IJ SOLN: 4.0000 mg | Freq: Once | INTRAMUSCULAR | Status: DC | PRN

## 2024-09-14 MED ORDER — HYDROMORPHONE HCL 1 MG/ML IJ SOLN
INTRAMUSCULAR | Status: AC
  Filled 2024-09-14: qty 0.5

## 2024-09-14 MED ORDER — METOCLOPRAMIDE HCL 5 MG PO TABS: 5.0000 mg | ORAL_TABLET | Freq: Three times a day (TID) | ORAL | Status: DC | PRN

## 2024-09-14 MED ORDER — CHLORHEXIDINE GLUCONATE 0.12 % MT SOLN
OROMUCOSAL | Status: AC
  Filled 2024-09-14: qty 15

## 2024-09-14 MED ORDER — STERILE WATER FOR IRRIGATION IR SOLN
Status: DC | PRN
Start: 2024-09-14 — End: 2024-09-14
  Administered 2024-09-14: 1000 mL

## 2024-09-14 MED ORDER — FENTANYL CITRATE (PF) 50 MCG/ML IJ SOSY
50.0000 ug | PREFILLED_SYRINGE | Freq: Once | INTRAMUSCULAR | Status: AC
  Administered 2024-09-14: 50 ug via INTRAVENOUS
  Filled 2024-09-14: qty 1

## 2024-09-14 MED ORDER — DOCUSATE SODIUM 100 MG PO CAPS
100.0000 mg | ORAL_CAPSULE | Freq: Two times a day (BID) | ORAL | Status: DC
  Administered 2024-09-14 – 2024-09-17 (×5): 100 mg via ORAL
  Filled 2024-09-14 (×5): qty 1

## 2024-09-14 MED ORDER — ONDANSETRON HCL 4 MG/2ML IJ SOLN
INTRAMUSCULAR | Status: DC | PRN
  Administered 2024-09-14: 4 mg via INTRAVENOUS

## 2024-09-14 MED ORDER — ENOXAPARIN SODIUM 40 MG/0.4ML IJ SOSY
40.0000 mg | PREFILLED_SYRINGE | INTRAMUSCULAR | Status: DC
  Administered 2024-09-15 – 2024-09-17 (×3): 40 mg via SUBCUTANEOUS
  Filled 2024-09-14 (×3): qty 0.4

## 2024-09-14 MED ORDER — CHLORHEXIDINE GLUCONATE 0.12 % MT SOLN
15.0000 mL | Freq: Once | OROMUCOSAL | Status: AC
  Administered 2024-09-14: 15 mL via OROMUCOSAL

## 2024-09-14 MED ORDER — HYDROMORPHONE HCL 1 MG/ML IJ SOLN
0.2500 mg | INTRAMUSCULAR | Status: DC | PRN
  Administered 2024-09-14: 0.5 mg via INTRAVENOUS

## 2024-09-14 MED ORDER — TRANEXAMIC ACID-NACL 1000-0.7 MG/100ML-% IV SOLN
INTRAVENOUS | Status: AC
  Filled 2024-09-14: qty 100

## 2024-09-14 MED ORDER — MORPHINE SULFATE (PF) 4 MG/ML IV SOLN
4.0000 mg | Freq: Once | INTRAVENOUS | Status: AC
  Administered 2024-09-14: 4 mg via INTRAVENOUS
  Filled 2024-09-14: qty 1

## 2024-09-14 MED ORDER — OXYCODONE HCL 5 MG PO TABS
5.0000 mg | ORAL_TABLET | ORAL | Status: DC | PRN
  Administered 2024-09-15: 10 mg via ORAL
  Filled 2024-09-14: qty 2

## 2024-09-14 MED ORDER — SUGAMMADEX SODIUM 200 MG/2ML IV SOLN
INTRAVENOUS | Status: DC | PRN
  Administered 2024-09-14: 200 mg via INTRAVENOUS

## 2024-09-14 MED ORDER — ROCURONIUM BROMIDE 10 MG/ML (PF) SYRINGE
PREFILLED_SYRINGE | INTRAVENOUS | Status: DC | PRN
  Administered 2024-09-14: 20 mg via INTRAVENOUS
  Administered 2024-09-14: 70 mg via INTRAVENOUS
  Administered 2024-09-14: 10 mg via INTRAVENOUS
  Administered 2024-09-14: 20 mg via INTRAVENOUS
  Administered 2024-09-14: 10 mg via INTRAVENOUS

## 2024-09-14 MED ORDER — HYDROMORPHONE HCL 1 MG/ML IJ SOLN
0.5000 mg | INTRAMUSCULAR | Status: DC | PRN
  Administered 2024-09-14: 0.5 mg via INTRAVENOUS
  Filled 2024-09-14: qty 1

## 2024-09-14 MED ORDER — LACTATED RINGERS IV SOLN: INTRAVENOUS | Status: DC

## 2024-09-14 MED ORDER — FENTANYL CITRATE (PF) 100 MCG/2ML IJ SOLN
INTRAMUSCULAR | Status: AC
  Filled 2024-09-14: qty 2

## 2024-09-14 MED ORDER — FENTANYL CITRATE (PF) 250 MCG/5ML IJ SOLN
INTRAMUSCULAR | Status: AC
  Filled 2024-09-14: qty 5

## 2024-09-14 MED ORDER — PHENOL 1.4 % MT LIQD: 1.0000 | OROMUCOSAL | Status: DC | PRN

## 2024-09-14 MED ORDER — DEXMEDETOMIDINE HCL IN NACL 80 MCG/20ML IV SOLN
INTRAVENOUS | Status: DC | PRN
Start: 2024-09-14 — End: 2024-09-14
  Administered 2024-09-14: 4 ug via INTRAVENOUS
  Administered 2024-09-14: 8 ug via INTRAVENOUS
  Administered 2024-09-14: 4 ug via INTRAVENOUS

## 2024-09-14 MED ORDER — FENTANYL CITRATE (PF) 250 MCG/5ML IJ SOLN
INTRAMUSCULAR | Status: DC | PRN
  Administered 2024-09-14: 50 ug via INTRAVENOUS
  Administered 2024-09-14: 100 ug via INTRAVENOUS
  Administered 2024-09-14 (×2): 50 ug via INTRAVENOUS
  Administered 2024-09-14: 100 ug via INTRAVENOUS

## 2024-09-14 MED ORDER — ACETAMINOPHEN 10 MG/ML IV SOLN
INTRAVENOUS | Status: DC | PRN
Start: 2024-09-14 — End: 2024-09-14
  Administered 2024-09-14: 1000 mg via INTRAVENOUS

## 2024-09-14 MED ORDER — ACETAMINOPHEN 325 MG PO TABS: 325.0000 mg | ORAL_TABLET | Freq: Four times a day (QID) | ORAL | Status: DC | PRN

## 2024-09-14 MED ORDER — OXYCODONE HCL 5 MG PO TABS
10.0000 mg | ORAL_TABLET | ORAL | Status: DC | PRN
  Administered 2024-09-14 – 2024-09-16 (×4): 15 mg via ORAL
  Administered 2024-09-16: 10 mg via ORAL
  Administered 2024-09-16 – 2024-09-17 (×4): 15 mg via ORAL
  Filled 2024-09-14 (×7): qty 3
  Filled 2024-09-14: qty 2
  Filled 2024-09-14: qty 3

## 2024-09-14 MED ORDER — HYDROMORPHONE HCL 1 MG/ML IJ SOLN
0.2500 mg | INTRAMUSCULAR | Status: DC | PRN
  Administered 2024-09-14 (×2): .25 mg via INTRAVENOUS

## 2024-09-14 MED ORDER — MUPIROCIN 2 % EX OINT
TOPICAL_OINTMENT | CUTANEOUS | Status: AC
  Administered 2024-09-14: 1 via TOPICAL
  Filled 2024-09-14: qty 22

## 2024-09-14 MED ORDER — CEFAZOLIN SODIUM-DEXTROSE 2-4 GM/100ML-% IV SOLN
2.0000 g | INTRAVENOUS | Status: AC
  Administered 2024-09-14: 2 g via INTRAVENOUS

## 2024-09-14 MED ORDER — FENTANYL CITRATE (PF) 100 MCG/2ML IJ SOLN
50.0000 ug | Freq: Once | INTRAMUSCULAR | Status: AC
  Administered 2024-09-14: 50 ug via INTRAVENOUS

## 2024-09-14 MED ORDER — CEFAZOLIN SODIUM-DEXTROSE 2-4 GM/100ML-% IV SOLN
INTRAVENOUS | Status: AC
Start: 2024-09-14 — End: 2024-09-14
  Filled 2024-09-14: qty 100

## 2024-09-14 MED ORDER — CHLORHEXIDINE GLUCONATE 4 % EX SOLN: 60.0000 mL | Freq: Once | CUTANEOUS | Status: AC

## 2024-09-14 MED ORDER — ONDANSETRON HCL 4 MG/2ML IJ SOLN: 4.0000 mg | Freq: Three times a day (TID) | INTRAMUSCULAR | Status: DC | PRN

## 2024-09-14 MED ORDER — 0.9 % SODIUM CHLORIDE (POUR BTL) OPTIME
TOPICAL | Status: DC | PRN
Start: 2024-09-14 — End: 2024-09-14
  Administered 2024-09-14: 1000 mL

## 2024-09-14 MED ORDER — CEFAZOLIN SODIUM-DEXTROSE 2-4 GM/100ML-% IV SOLN
2.0000 g | Freq: Three times a day (TID) | INTRAVENOUS | Status: AC
  Administered 2024-09-14 – 2024-09-15 (×3): 2 g via INTRAVENOUS
  Filled 2024-09-14 (×3): qty 100

## 2024-09-14 MED ORDER — MUPIROCIN 2 % EX OINT: 1.0000 | TOPICAL_OINTMENT | Freq: Once | CUTANEOUS | Status: AC

## 2024-09-14 SURGICAL SUPPLY — 46 items
ALCOHOL 70% 16 OZ (MISCELLANEOUS) ×2 IMPLANT
BAG COUNTER SPONGE SURGICOUNT (BAG) ×2 IMPLANT
BIT DRILL CROWE POINT TWST 4.3 (DRILL) IMPLANT
BNDG COHESIVE 4X5 TAN NS LF (GAUZE/BANDAGES/DRESSINGS) ×2 IMPLANT
CHLORAPREP W/TINT 26 (MISCELLANEOUS) ×2 IMPLANT
COVER PERINEAL POST (MISCELLANEOUS) ×2 IMPLANT
COVER SURGICAL LIGHT HANDLE (MISCELLANEOUS) ×2 IMPLANT
DERMABOND ADVANCED .7 DNX12 (GAUZE/BANDAGES/DRESSINGS) ×4 IMPLANT
DRAPE C-ARM 42X72 X-RAY (DRAPES) ×2 IMPLANT
DRAPE C-ARMOR (DRAPES) ×2 IMPLANT
DRAPE IMP U-DRAPE 54X76 (DRAPES) ×4 IMPLANT
DRAPE STERI IOBAN 125X83 (DRAPES) ×2 IMPLANT
DRAPE SURG ISO 125X83 STRL (DRAPES) IMPLANT
DRAPE U-SHAPE 47X51 STRL (DRAPES) ×4 IMPLANT
DRESSING MEPILEX FLEX 4X4 (GAUZE/BANDAGES/DRESSINGS) ×6 IMPLANT
DRILL FULL THD RECON 6.5 (DRILL) IMPLANT
DRSG AQUACEL AG 3.5X4 (GAUZE/BANDAGES/DRESSINGS) IMPLANT
DRSG AQUACEL AG ADV 3.5X 6 (GAUZE/BANDAGES/DRESSINGS) IMPLANT
ELECTRODE REM PT RTRN 9FT ADLT (ELECTROSURGICAL) ×2 IMPLANT
GLOVE BIO SURGEON STRL SZ7 (GLOVE) ×2 IMPLANT
GLOVE BIO SURGEON STRL SZ8.5 (GLOVE) ×4 IMPLANT
GLOVE BIOGEL PI IND STRL 7.5 (GLOVE) ×2 IMPLANT
GLOVE BIOGEL PI IND STRL 8.5 (GLOVE) ×2 IMPLANT
GOWN SPEC L4 XLG W/TWL (GOWN DISPOSABLE) ×2 IMPLANT
GOWN STRL REUS W/ TWL XL LVL3 (GOWN DISPOSABLE) ×2 IMPLANT
GOWN STRL REUS W/TWL 2XL LVL3 (GOWN DISPOSABLE) ×2 IMPLANT
GUIDEPIN VERSANAIL DSP 3.2X444 (ORTHOPEDIC DISPOSABLE SUPPLIES) IMPLANT
GUIDEWIRE BEAD TIP 100 ST (WIRE) IMPLANT
HOOD PEEL AWAY T7 (MISCELLANEOUS) ×6 IMPLANT
KIT TURNOVER KIT B (KITS) ×2 IMPLANT
MANIFOLD NEPTUNE II (INSTRUMENTS) ×2 IMPLANT
MARKER SKIN DUAL TIP RULER LAB (MISCELLANEOUS) ×2 IMPLANT
NAIL AFFIXUS TRCH RT 12X440 ST (Nail) IMPLANT
PACK GENERAL/GYN (CUSTOM PROCEDURE TRAY) ×2 IMPLANT
PACK UNIVERSAL I (CUSTOM PROCEDURE TRAY) ×2 IMPLANT
PAD ARMBOARD POSITIONER FOAM (MISCELLANEOUS) ×4 IMPLANT
SCREW CORT AFFIX ST 5X44 (Screw) IMPLANT
SCREW FULL THD 6.5X95 ST (Screw) IMPLANT
SCREW SOLID FULL THD 6.5X85 ST (Screw) IMPLANT
SOLN 0.9% NACL POUR BTL 1000ML (IV SOLUTION) ×2 IMPLANT
STAPLER SKIN PROX 35W (STAPLE) ×2 IMPLANT
SUT MON AB 2-0 CT1 27 (SUTURE) ×2 IMPLANT
SUT MON AB 2-0 CT1 36 (SUTURE) ×2 IMPLANT
SUT VIC AB 1 CT1 27XBRD ANBCTR (SUTURE) ×2 IMPLANT
TOWEL GREEN STERILE (TOWEL DISPOSABLE) ×2 IMPLANT
TOWEL GREEN STERILE FF (TOWEL DISPOSABLE) ×2 IMPLANT

## 2024-09-14 NOTE — Plan of Care (Signed)
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
## 2024-09-14 NOTE — ED Notes (Signed)
 Called [facility] per tara  [phone] [id number]
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
## 2024-09-14 NOTE — Transfer of Care (Signed)
 Immediate Anesthesia Transfer of Care Note  Patient: Marvin Phillips  Procedure(s) Performed: INSERTION, INTRAMEDULLARY ROD, FEMUR (Right: Leg Upper)  Patient Location: PACU  Anesthesia Type:General  Level of Consciousness: drowsy  Airway & Oxygen Therapy: Patient Spontanous Breathing and Patient connected to face mask oxygen  Post-op Assessment: Report given to RN and Post -op Vital signs reviewed and stable  Post vital signs: Reviewed and stable  Last Vitals:  Vitals Value Taken Time  BP 100/55 09/14/24 15:32  Temp    Pulse 93 09/14/24 15:36  Resp 16 09/14/24 15:36  SpO2 95 % 09/14/24 15:36  Vitals shown include unfiled device data.  Last Pain:  Vitals:   09/14/24 1200  TempSrc:   PainSc: Asleep         Complications: No notable events documented.
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
## 2024-09-14 NOTE — ED Notes (Signed)
 Pt tx to [facility] at PACU.
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
## 2024-09-14 NOTE — Anesthesia Procedure Notes (Signed)
 Procedure Name: Intubation Date/Time: 09/14/2024 1:26 PM  Performed by: Glennie Rodda J, CRNAPre-anesthesia Checklist: Patient identified, Emergency Drugs available, Suction available and Patient being monitored Patient Re-evaluated:Patient Re-evaluated prior to induction Oxygen Delivery Method: Circle System Utilized Preoxygenation: Pre-oxygenation with 100% oxygen Induction Type: IV induction Ventilation: Mask ventilation without difficulty Laryngoscope Size: Miller and 3 Grade View: Grade I Tube type: Oral Tube size: 7.5 mm Number of attempts: 1 Airway Equipment and Method: Stylet and Oral airway Placement Confirmation: ETT inserted through vocal cords under direct vision, positive ETCO2 and breath sounds checked- equal and bilateral Secured at: 23 cm Tube secured with: Tape Dental Injury: Teeth and Oropharynx as per pre-operative assessment
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
## 2024-09-14 NOTE — ED Notes (Signed)
 Ortho at bedside.

## 2024-09-14 NOTE — ED Notes (Signed)
 Pt wounds cleaned and bandaged. Pt placed on hospital bed and position of comfort.
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
## 2024-09-14 NOTE — Progress Notes (Signed)
 Orthopedic Tech Progress Note Patient Details:  Marvin Phillips 19-Sep-1991 [id number]  Musculoskeletal Traction Type of Traction: Bucks Skin Traction Traction Location: RLE Traction Weight: 10 lbs   Post Interventions Patient Tolerated: Well  [name] 09/14/2024, 9:07 AM
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
## 2024-09-14 NOTE — Anesthesia Postprocedure Evaluation (Signed)
 Anesthesia Post Note  Patient: Carlin SAUNDERS Glennon II  Procedure(s) Performed: INSERTION, INTRAMEDULLARY ROD, FEMUR (Right: Leg Upper)     Patient location during evaluation: Nursing Unit Anesthesia Type: General Level of consciousness: awake and alert Pain management: pain level controlled Vital Signs Assessment: post-procedure vital signs reviewed and stable Respiratory status: spontaneous breathing, nonlabored ventilation and respiratory function stable Cardiovascular status: blood pressure returned to baseline and stable Postop Assessment: no apparent nausea or vomiting Anesthetic complications: no   No notable events documented.  Last Vitals:  Vitals:   09/14/24 1652 09/14/24 1937  BP: 102/70 134/81  Pulse: 62 82  Resp: 18 16  Temp: 36.6 C 36.8 C  SpO2: 94% 100%    Last Pain:  Vitals:   09/14/24 2050  TempSrc:   PainSc: 5                  Garnette FORBES Skillern

## 2024-09-15 DIAGNOSIS — Z8781 Personal history of (healed) traumatic fracture: Secondary | ICD-10-CM | POA: Diagnosis not present

## 2024-09-15 DIAGNOSIS — F10129 Alcohol abuse with intoxication, unspecified: Secondary | ICD-10-CM | POA: Diagnosis present

## 2024-09-15 DIAGNOSIS — F4310 Post-traumatic stress disorder, unspecified: Secondary | ICD-10-CM | POA: Diagnosis present

## 2024-09-15 DIAGNOSIS — Z91030 Bee allergy status: Secondary | ICD-10-CM | POA: Diagnosis not present

## 2024-09-15 DIAGNOSIS — F17210 Nicotine dependence, cigarettes, uncomplicated: Secondary | ICD-10-CM | POA: Diagnosis present

## 2024-09-15 DIAGNOSIS — Z91018 Allergy to other foods: Secondary | ICD-10-CM | POA: Diagnosis not present

## 2024-09-15 DIAGNOSIS — Z23 Encounter for immunization: Secondary | ICD-10-CM | POA: Diagnosis not present

## 2024-09-15 DIAGNOSIS — Z91013 Allergy to seafood: Secondary | ICD-10-CM | POA: Diagnosis not present

## 2024-09-15 DIAGNOSIS — S61531A Puncture wound without foreign body of right wrist, initial encounter: Secondary | ICD-10-CM | POA: Diagnosis present

## 2024-09-15 DIAGNOSIS — S31814A Puncture wound with foreign body of right buttock, initial encounter: Secondary | ICD-10-CM | POA: Diagnosis present

## 2024-09-15 DIAGNOSIS — Z79899 Other long term (current) drug therapy: Secondary | ICD-10-CM | POA: Diagnosis not present

## 2024-09-15 DIAGNOSIS — E872 Acidosis, unspecified: Secondary | ICD-10-CM | POA: Diagnosis present

## 2024-09-15 DIAGNOSIS — S72351B Displaced comminuted fracture of shaft of right femur, initial encounter for open fracture type I or II: Secondary | ICD-10-CM | POA: Diagnosis present

## 2024-09-15 DIAGNOSIS — Z9109 Other allergy status, other than to drugs and biological substances: Secondary | ICD-10-CM | POA: Diagnosis not present

## 2024-09-15 LAB — BASIC METABOLIC PANEL WITH GFR
Anion gap: 11 (ref 5–15)
BUN: 6 mg/dL (ref 6–20)
CO2: 24 mmol/L (ref 22–32)
Calcium: 8.8 mg/dL — ABNORMAL LOW (ref 8.9–10.3)
Chloride: 103 mmol/L (ref 98–111)
Creatinine, Ser: 0.98 mg/dL (ref 0.61–1.24)
GFR, Estimated: >60 mL/min (ref >60)
Glucose, Bld: 103 mg/dL — ABNORMAL HIGH (ref 70–99)
Potassium: 3.8 mmol/L (ref 3.5–5.1)
Sodium: 138 mmol/L (ref 135–145)

## 2024-09-15 LAB — CBC
HCT: 39.3 % (ref 39.0–52.0)
Hemoglobin: 13.8 g/dL (ref 13.0–17.0)
MCH: 34.4 pg — ABNORMAL HIGH (ref 26.0–34.0)
MCHC: 35.1 g/dL (ref 30.0–36.0)
MCV: 98 fL (ref 80.0–100.0)
Platelets: 202 K/uL (ref 150–400)
RBC: 4.01 MIL/uL — ABNORMAL LOW (ref 4.22–5.81)
RDW: 13.4 % (ref 11.5–15.5)
WBC: 16.3 K/uL — ABNORMAL HIGH (ref 4.0–10.5)
nRBC: 0 % (ref 0.0–0.2)

## 2024-09-15 NOTE — Progress Notes (Signed)
 Slight drainage from R wrist, area dressed w/ xeroform, gauze, and ace wrap, pt tolerated well

## 2024-09-15 NOTE — Progress Notes (Signed)
   Subjective: 1 Day Post-Op Procedure(s) (LRB): INSERTION, INTRAMEDULLARY ROD, FEMUR (Right)  Pt resting comfortably in mild pain Able to ambulate to restroom overnight Dressing per nursing for right wrist Denies any new symptoms overnight Patient reports pain as mild.  Objective:   VITALS:   Vitals:   09/14/24 2350 09/15/24 0420  BP: 102/74 115/76  Pulse: 65 61  Resp: 16 17  Temp: 98.2 F (36.8 C) 97.9 F (36.6 C)  SpO2: 100% 100%    Right hip incisions healing well, dressings in place No drainage or erythema Nv intact distally Gentle rom without guarding Right wrist with dressing/ace in place Nv intact distally with full rom   LABS Recent Labs    09/14/24 0416 09/14/24 0427 09/15/24 0437  HGB 15.7 16.3 13.8  HCT 45.1 48.0 39.3  WBC 17.8*  --  16.3*  PLT 206  --  202    Recent Labs    09/14/24 0416 09/14/24 0427 09/15/24 0437  NA 139 142 138  K 3.1* 3.2* 3.8  BUN 6 6 6   CREATININE 1.22 1.30* 0.98  GLUCOSE 125* 121* 103*     Assessment/Plan: 1 Day Post-Op Procedure(s) (LRB): INSERTION, INTRAMEDULLARY ROD, FEMUR (Right) PT/OT Continue pain management Pulmonary toilet D/c planning   Brad Melvenia RIGGERS, MPAS [facility] is now [facility]  [facility] [address], [place], [place] [address] Phone: [phone] [email or web address] Facebook  [employer]
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
## 2024-09-16 LAB — BASIC METABOLIC PANEL WITH GFR
Anion gap: 12 (ref 5–15)
BUN: 6 mg/dL (ref 6–20)
CO2: 26 mmol/L (ref 22–32)
Calcium: 8.6 mg/dL — ABNORMAL LOW (ref 8.9–10.3)
Chloride: 100 mmol/L (ref 98–111)
Creatinine, Ser: 1.04 mg/dL (ref 0.61–1.24)
GFR, Estimated: >60 mL/min (ref >60)
Glucose, Bld: 84 mg/dL (ref 70–99)
Potassium: 3.5 mmol/L (ref 3.5–5.1)
Sodium: 138 mmol/L (ref 135–145)

## 2024-09-16 LAB — CBC
HCT: 38.5 % — ABNORMAL LOW (ref 39.0–52.0)
Hemoglobin: 13.4 g/dL (ref 13.0–17.0)
MCH: 33.8 pg (ref 26.0–34.0)
MCHC: 34.8 g/dL (ref 30.0–36.0)
MCV: 97.2 fL (ref 80.0–100.0)
Platelets: 192 K/uL (ref 150–400)
RBC: 3.96 MIL/uL — ABNORMAL LOW (ref 4.22–5.81)
RDW: 13.3 % (ref 11.5–15.5)
WBC: 12.9 K/uL — ABNORMAL HIGH (ref 4.0–10.5)
nRBC: 0 % (ref 0.0–0.2)

## 2024-09-16 MED ORDER — ACETAMINOPHEN 500 MG PO TABS
1000.0000 mg | ORAL_TABLET | Freq: Three times a day (TID) | ORAL | Status: DC
  Administered 2024-09-16 – 2024-09-17 (×3): 1000 mg via ORAL
  Filled 2024-09-16 (×3): qty 2

## 2024-09-16 MED ORDER — CELECOXIB 100 MG PO CAPS
100.0000 mg | ORAL_CAPSULE | Freq: Two times a day (BID) | ORAL | Status: DC
  Administered 2024-09-16 – 2024-09-17 (×2): 100 mg via ORAL
  Filled 2024-09-16 (×2): qty 1
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
## 2024-09-16 NOTE — TOC CAGE-AID Note (Signed)
 Transition of Care [facility]) - CAGE-AID Screening   Patient Details  Name: Marvin Phillips MRN: [id number] Date of Birth: Mar 11, 1991  [facility] of [facility] CM/SW Contact:    Avontae Burkhead E Vyla Pint, LCSW Phone Number: 09/16/2024, 2:09 PM   Clinical Narrative: Patient reports occasionally he will take a few shots of alcohol. Patient denies SA resource needs.   CAGE-AID Screening:    Have You Ever Felt You Ought to Cut Down on Your Drinking or Drug Use?: No Have People Annoyed You By Critizing Your Drinking Or Drug Use?: No Have You Felt Bad Or Guilty About Your Drinking Or Drug Use?: No Have You Ever Had a Drink or Used Drugs First Thing In The Morning to Steady Your Nerves or to Get Rid of a Hangover?: No CAGE-AID Score: 0  Substance Abuse Education Offered: Yes

## 2024-09-16 NOTE — Progress Notes (Addendum)
    Subjective:  Patient reports pain as mild to moderate.  Denies N/V/CP/SOB/Abd pain. Denies tingling or numbness in LE bilaterally.  Voiding without difficulty. Positive flatus.  Some difficulty with pain control.  Discussed WBAT RLE.  Hopeful for d/c home tomorrow.   Objective:   VITALS:   Vitals:   09/15/24 1948 09/16/24 0254 09/16/24 0724 09/16/24 1328  BP: 123/82 124/84 109/67 110/72  Pulse: 87 (!) 57 68 74  Resp: 20 18    Temp: 98.4 F (36.9 C) 97.9 F (36.6 C) 98.1 F (36.7 C) 98.3 F (36.8 C)  TempSrc: Oral Oral Oral Oral  SpO2: 100% 97% 100% 97%  Weight:      Height:        NAD Neurologically intact ABD soft Neurovascular intact Sensation intact distally Intact pulses distally Dorsiflexion/Plantar flexion intact Incision: dressing C/D/I No cellulitis present Compartment soft   Lab Results  Component Value Date   WBC 12.9 (H) 09/16/2024   HGB 13.4 09/16/2024   HCT 38.5 (L) 09/16/2024   MCV 97.2 09/16/2024   PLT 192 09/16/2024   BMET    Component Value Date/Time   NA 138 09/16/2024 0615   K 3.5 09/16/2024 0615   CL 100 09/16/2024 0615   CO2 26 09/16/2024 0615   GLUCOSE 84 09/16/2024 0615   BUN 6 09/16/2024 0615   CREATININE 1.04 09/16/2024 0615   CALCIUM 8.6 (L) 09/16/2024 0615   GFRNONAA >60 09/16/2024 0615     Assessment/Plan: 2 Days Post-Op   Principal Problem:   Femur fracture, right (HCC) Active Problems:   Open fracture of shaft of right femur (HCC)   WBAT with walker DVT ppx: Lovenox, SCDs, TEDS PO pain control PT/OT: He ambulated 75 feet with PT yesterday. Continue today.  Dispo:  - D/c home once cleared with PT and pain control.   Valery GORMAN Potters 09/16/2024, 3:47 PM   [facility]  [facility] [place] [address], [place], [place] [address] Phone: [phone] [email or web address] Facebook  [employer]

## 2024-09-16 NOTE — Plan of Care (Signed)
  Problem: Activity: Goal: Risk for activity intolerance will decrease Outcome: Progressing   Problem: Pain Managment: Goal: General experience of comfort will improve and/or be controlled Outcome: Progressing   Problem: Safety: Goal: Ability to remain free from injury will improve Outcome: Progressing   Problem: Skin Integrity: Goal: Risk for impaired skin integrity will decrease Outcome: Progressing
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
## 2024-09-16 NOTE — Discharge Instructions (Signed)
 Dr. Redell Shoals Adult Hip & Knee Specialist [facility] [address], [address] [place], [place] [address] [phone]   POSTOPERATIVE DIRECTIONS    Hip Rehabilitation, Guidelines Following Surgery   WEIGHT BEARING Weight bearing as tolerated with assist device (walker, cane, etc) as directed, use it as long as suggested by your surgeon or therapist, typically at least 4-6 weeks.   HOME CARE INSTRUCTIONS  Remove items at home which could result in a fall. This includes throw rugs or furniture in walking pathways.  Continue medications as instructed at time of discharge. You may have some home medications which will be placed on hold until you complete the course of blood thinner medication. 4 days after discharge, you may start showering. No tub baths or soaking your incisions. Do not put on socks or shoes without following the instructions of your caregivers.   Sit on chairs with arms. Use the chair arms to help push yourself up when arising.  Arrange for the use of a toilet seat elevator so you are not sitting low.  Walk with walker as instructed.  You may resume a sexual relationship in one month or when given the OK by your caregiver.  Use walker as long as suggested by your caregivers.  Avoid periods of inactivity such as sitting longer than an hour when not asleep. This helps prevent blood clots.  You may return to work once you are cleared by designer, industrial/product.  Do not drive a car for 6 weeks or until released by your surgeon.  Do not drive while taking narcotics.  Wear elastic stockings for two weeks following surgery during the day but you may remove then at night.  Make sure you keep all of your appointments after your operation with all of your doctors and caregivers. You should call the office at the above phone number and make an appointment for approximately two weeks after the date of your surgery. Please pick up a stool softener and laxative for  home use as long as you are requiring pain medications. ICE to the affected hip every three hours for 30 minutes at a time and then as needed for pain and swelling. Continue to use ice on the hip for pain and swelling from surgery. You may notice swelling that will progress down to the foot and ankle.  This is normal after surgery.  Elevate the leg when you are not up walking on it.   It is important for you to complete the blood thinner medication as prescribed by your doctor. Continue to use the breathing machine which will help keep your temperature down.  It is common for your temperature to cycle up and down following surgery, especially at night when you are not up moving around and exerting yourself.  The breathing machine keeps your lungs expanded and your temperature down.  RANGE OF MOTION AND STRENGTHENING EXERCISES  These exercises are designed to help you keep full movement of your hip joint. Follow your caregiver's or physical therapist's instructions. Perform all exercises about fifteen times, three times per day or as directed. Exercise both hips, even if you have had only one joint replacement. These exercises can be done on a training (exercise) mat, on the floor, on a table or on a bed. Use whatever works the best and is most comfortable for you. Use music or television while you are exercising so that the exercises are a pleasant break in your day. This will make your life better  with the exercises acting as a break in routine you can look forward to.  Lying on your back, slowly slide your foot toward your buttocks, raising your knee up off the floor. Then slowly slide your foot back down until your leg is straight again.  Lying on your back spread your legs as far apart as you can without causing discomfort.  Lying on your side, raise your upper leg and foot straight up from the floor as far as is comfortable. Slowly lower the leg and repeat.  Lying on your back, tighten up the muscle  in the front of your thigh (quadriceps muscles). You can do this by keeping your leg straight and trying to raise your heel off the floor. This helps strengthen the largest muscle supporting your knee.  Lying on your back, tighten up the muscles of your buttocks both with the legs straight and with the knee bent at a comfortable angle while keeping your heel on the floor.   SKILLED REHAB INSTRUCTIONS: If the patient is transferred to a skilled rehab facility following release from the hospital, a list of the current medications will be sent to the facility for the patient to continue.  When discharged from the skilled rehab facility, please have the facility set up the patient's Home Health Physical Therapy prior to being released. Also, the skilled facility will be responsible for providing the patient with their medications at time of release from the facility to include their pain medication and their blood thinner medication. If the patient is still at the rehab facility at time of the two week follow up appointment, the skilled rehab facility will also need to assist the patient in arranging follow up appointment in our office and any transportation needs.  MAKE SURE YOU:  Understand these instructions.  Will watch your condition.  Will get help right away if you are not doing well or get worse.  WBAT with walker.  Pick up stool softner and laxative for home use following surgery while on pain medications. You can shower with your dressings on.  No tub baths or soaking the incisions. Continue to use ice for pain and swelling after surgery. Do not use any lotions or creams on the incision until instructed by your surgeon.

## 2024-09-17 ENCOUNTER — Encounter (HOSPITAL_COMMUNITY): Payer: Self-pay | Admitting: *Deleted

## 2024-09-17 LAB — BASIC METABOLIC PANEL WITH GFR
Anion gap: 10 (ref 5–15)
BUN: 11 mg/dL (ref 6–20)
CO2: 28 mmol/L (ref 22–32)
Calcium: 8.7 mg/dL — ABNORMAL LOW (ref 8.9–10.3)
Chloride: 100 mmol/L (ref 98–111)
Creatinine, Ser: 1.16 mg/dL (ref 0.61–1.24)
GFR, Estimated: >60 mL/min (ref >60)
Glucose, Bld: 93 mg/dL (ref 70–99)
Potassium: 3.6 mmol/L (ref 3.5–5.1)
Sodium: 138 mmol/L (ref 135–145)

## 2024-09-17 LAB — CBC
HCT: 39.5 % (ref 39.0–52.0)
Hemoglobin: 13.6 g/dL (ref 13.0–17.0)
MCH: 33.7 pg (ref 26.0–34.0)
MCHC: 34.4 g/dL (ref 30.0–36.0)
MCV: 97.8 fL (ref 80.0–100.0)
Platelets: 190 K/uL (ref 150–400)
RBC: 4.04 MIL/uL — ABNORMAL LOW (ref 4.22–5.81)
RDW: 13.1 % (ref 11.5–15.5)
WBC: 10.7 K/uL — ABNORMAL HIGH (ref 4.0–10.5)
nRBC: 0 % (ref 0.0–0.2)

## 2024-09-17 MED ORDER — POLYETHYLENE GLYCOL 3350 17 G PO PACK
17.0000 g | PACK | Freq: Every day | ORAL | Status: DC
  Administered 2024-09-17: 17 g via ORAL

## 2024-09-17 MED ORDER — DOCUSATE SODIUM 100 MG PO CAPS: 100.0000 mg | ORAL_CAPSULE | Freq: Two times a day (BID) | ORAL | 0 refills | Status: AC

## 2024-09-17 MED ORDER — IBUPROFEN 800 MG PO TABS: 800.0000 mg | ORAL_TABLET | Freq: Three times a day (TID) | ORAL | 3 refills | Status: AC | PRN

## 2024-09-17 MED ORDER — POLYETHYLENE GLYCOL 3350 17 G PO PACK: 17.0000 g | PACK | Freq: Every day | ORAL | 0 refills | Status: AC | PRN

## 2024-09-17 MED ORDER — METHOCARBAMOL 500 MG PO TABS: 500.0000 mg | ORAL_TABLET | Freq: Four times a day (QID) | ORAL | 0 refills | Status: AC | PRN

## 2024-09-17 MED ORDER — ACETAMINOPHEN 500 MG PO TABS: 1000.0000 mg | ORAL_TABLET | Freq: Three times a day (TID) | ORAL | 0 refills | Status: AC

## 2024-09-17 MED ORDER — OXYCODONE HCL 5 MG PO TABS: 5.0000 mg | ORAL_TABLET | ORAL | 0 refills | Status: AC | PRN

## 2024-09-17 MED ORDER — SENNA 8.6 MG PO TABS: 2.0000 | ORAL_TABLET | Freq: Every day | ORAL | 0 refills | Status: AC

## 2024-09-17 MED ORDER — ASPIRIN 81 MG PO CHEW: 81.0000 mg | CHEWABLE_TABLET | Freq: Two times a day (BID) | ORAL | 0 refills | Status: AC

## 2024-09-17 MED ORDER — ONDANSETRON HCL 4 MG PO TABS: 4.0000 mg | ORAL_TABLET | Freq: Three times a day (TID) | ORAL | 0 refills | Status: AC | PRN
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
## 2024-09-17 NOTE — Progress Notes (Signed)
 Patient aware about d/c and bowel movement. He does have positive flatus. Recommend continued senna and colace, with addition of miralax. He is aware of risks.

## 2024-09-17 NOTE — Progress Notes (Signed)
 Pt discharged home with significant other in stable condition. DC instructions given. Scripts sent to pharmacy of choice. No immediate questions or concerns at this time. Pt chose to ambulate off the department.

## 2024-09-17 NOTE — Plan of Care (Signed)
  Problem: Clinical Measurements: Goal: Will remain free from infection Outcome: Not Progressing   Problem: Activity: Goal: Risk for activity intolerance will decrease Outcome: Not Progressing   Problem: Elimination: Goal: Will not experience complications related to bowel motility Outcome: Not Progressing   Problem: Pain Managment: Goal: General experience of comfort will improve and/or be controlled Outcome: Not Progressing   Problem: Safety: Goal: Ability to remain free from injury will improve Outcome: Not Progressing
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
## 2024-09-17 NOTE — Plan of Care (Signed)
  Problem: Education: Goal: Knowledge of General Education information will improve Description: Including pain rating scale, medication(s)/side effects and non-pharmacologic comfort measures Outcome: Adequate for Discharge   Problem: Health Behavior/Discharge Planning: Goal: Ability to manage health-related needs will improve Outcome: Adequate for Discharge   Problem: Clinical Measurements: Goal: Ability to maintain clinical measurements within normal limits will improve Outcome: Adequate for Discharge Goal: Will remain free from infection Outcome: Adequate for Discharge Goal: Diagnostic test results will improve Outcome: Adequate for Discharge Goal: Respiratory complications will improve Outcome: Adequate for Discharge Goal: Cardiovascular complication will be avoided Outcome: Adequate for Discharge   Problem: Activity: Goal: Risk for activity intolerance will decrease Outcome: Adequate for Discharge   Problem: Nutrition: Goal: Adequate nutrition will be maintained Outcome: Adequate for Discharge   Problem: Coping: Goal: Level of anxiety will decrease Outcome: Adequate for Discharge   Problem: Elimination: Goal: Will not experience complications related to bowel motility Outcome: Adequate for Discharge Goal: Will not experience complications related to urinary retention Outcome: Adequate for Discharge   Problem: Pain Managment: Goal: General experience of comfort will improve and/or be controlled Outcome: Adequate for Discharge   Problem: Safety: Goal: Ability to remain free from injury will improve Outcome: Adequate for Discharge   Problem: Skin Integrity: Goal: Risk for impaired skin integrity will decrease Outcome: Adequate for Discharge   Problem: Education: Goal: Verbalization of understanding the information provided (i.e., activity precautions, restrictions, etc) will improve Outcome: Adequate for Discharge Goal: Individualized Educational  Video(s) Outcome: Adequate for Discharge   Problem: Activity: Goal: Ability to ambulate and perform ADLs will improve Outcome: Adequate for Discharge   Problem: Clinical Measurements: Goal: Postoperative complications will be avoided or minimized Outcome: Adequate for Discharge   Problem: Self-Concept: Goal: Ability to maintain and perform role responsibilities to the fullest extent possible will improve Outcome: Adequate for Discharge   Problem: Pain Management: Goal: Pain level will decrease Outcome: Adequate for Discharge

## 2024-09-18 ENCOUNTER — Encounter (HOSPITAL_COMMUNITY): Payer: Self-pay | Admitting: Orthopedic Surgery

## 2024-09-18 NOTE — Discharge Summary (Signed)
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 stool softener, such as Colace (over the counter) 100 mg twice a day.  Use MiraLax (over the counter) for constipation as needed.   DO NOT drive, shower or take a tub bath until instructed by your physician   Complete by: As directed    Diet - low sodium heart healthy   Complete by: As directed    Discharge instructions   Complete by: As directed    Elevate toes above nose. Use cryotherapy as needed for pain and swelling.  Do not remove your dressings.   Driving restrictions   Complete by: As directed    No driving for 6 weeks   Increase activity slowly as tolerated   Complete by:  As directed    Lifting restrictions   Complete by: As directed    No lifting for 6 weeks   Post-operative opioid taper instructions:   Complete by: As directed    POST-OPERATIVE OPIOID TAPER INSTRUCTIONS: It is important to wean off of your opioid medication as soon as possible. If you do not need pain medication after your surgery it is ok to stop day one. Opioids include: Codeine, Hydrocodone (Norco, Vicodin), Oxycodone (Percocet, oxycontin ) and hydromorphone  amongst others.  Long term and even short term use of opiods can cause: Increased pain response Dependence Constipation Depression Respiratory depression And more.  Withdrawal symptoms can include Flu like symptoms Nausea, vomiting And more Techniques to manage these symptoms Hydrate well Eat regular healthy meals Stay active Use relaxation techniques(deep breathing, meditating, yoga) Do Not substitute Alcohol to help with tapering If you have been on opioids for less than two weeks and do not have pain than it is ok to stop all together.  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 Maintain the same interval or time between taking each dose and first decrease the dose.  Cut the total daily intake of opioids by one tablet each day Next start to increase the time between doses. The last dose that should be eliminated is the evening dose.      TED hose   Complete by: As directed    Use stockings (TED hose) for 2 weeks on both leg(s).  You may remove them at night for sleeping.   Weight bearing as tolerated   Complete by: As directed         Follow-up Information     Leigh Valery RAMAN, PA-C. Schedule an appointment as soon as possible for a visit in 2 week(s).   Specialty: Orthopedic Surgery Why: For suture removal, For wound re-check Contact information: [address], [address] [place] [address] [phone]                  Signed: Valery RAMAN Leigh 09/18/2024, 1:16 PM
